# Patient Record
Sex: Male | Born: 1970 | Race: White | Hispanic: No | Smoking: Current every day smoker
Health system: Southern US, Community
[De-identification: ages and names within clinical notes are randomized; demographics above are authoritative.]

## PROBLEM LIST (undated history)

## (undated) DIAGNOSIS — Z5189 Encounter for other specified aftercare: Secondary | ICD-10-CM

## (undated) DIAGNOSIS — B019 Varicella without complication: Secondary | ICD-10-CM

## (undated) DIAGNOSIS — H409 Unspecified glaucoma: Secondary | ICD-10-CM

## (undated) DIAGNOSIS — Z6372 Alcoholism and drug addiction in family: Secondary | ICD-10-CM

## (undated) DIAGNOSIS — N189 Chronic kidney disease, unspecified: Secondary | ICD-10-CM

## (undated) DIAGNOSIS — F32A Depression, unspecified: Secondary | ICD-10-CM

## (undated) DIAGNOSIS — F1910 Other psychoactive substance abuse, uncomplicated: Secondary | ICD-10-CM

## (undated) DIAGNOSIS — F419 Anxiety disorder, unspecified: Secondary | ICD-10-CM

## (undated) DIAGNOSIS — F329 Major depressive disorder, single episode, unspecified: Secondary | ICD-10-CM

## (undated) HISTORY — DX: Unspecified glaucoma: H40.9

## (undated) HISTORY — DX: Anxiety disorder, unspecified: F41.9

## (undated) HISTORY — DX: Other psychoactive substance abuse, uncomplicated: F19.10

## (undated) HISTORY — PX: WRIST SURGERY: SHX841

## (undated) HISTORY — DX: Varicella without complication: B01.9

## (undated) HISTORY — DX: Encounter for other specified aftercare: Z51.89

## (undated) HISTORY — PX: OTHER SURGICAL HISTORY: SHX169

## (undated) HISTORY — PX: KNEE SURGERY: SHX244

## (undated) HISTORY — DX: Alcoholism and drug addiction in family: Z63.72

---

## 2005-09-25 ENCOUNTER — Emergency Department: Payer: Self-pay | Admitting: Emergency Medicine

## 2009-03-19 ENCOUNTER — Emergency Department: Payer: Self-pay | Admitting: Emergency Medicine

## 2009-04-02 ENCOUNTER — Inpatient Hospital Stay: Payer: Self-pay | Admitting: Psychiatry

## 2009-11-29 ENCOUNTER — Ambulatory Visit: Payer: Self-pay | Admitting: Unknown Physician Specialty

## 2011-06-24 ENCOUNTER — Emergency Department: Payer: Self-pay | Admitting: Internal Medicine

## 2012-06-02 ENCOUNTER — Emergency Department: Payer: Self-pay | Admitting: Emergency Medicine

## 2012-08-05 ENCOUNTER — Emergency Department: Payer: Self-pay | Admitting: Emergency Medicine

## 2013-04-29 ENCOUNTER — Emergency Department: Payer: Self-pay | Admitting: Emergency Medicine

## 2013-08-24 ENCOUNTER — Emergency Department: Payer: Self-pay | Admitting: Emergency Medicine

## 2013-08-24 LAB — COMPREHENSIVE METABOLIC PANEL
Albumin: 3.5 g/dL (ref 3.4–5.0)
Alkaline Phosphatase: 163 U/L — ABNORMAL HIGH
Anion Gap: 8 (ref 7–16)
BILIRUBIN TOTAL: 0.3 mg/dL (ref 0.2–1.0)
BUN: 9 mg/dL (ref 7–18)
CALCIUM: 9 mg/dL (ref 8.5–10.1)
CO2: 30 mmol/L (ref 21–32)
Chloride: 102 mmol/L (ref 98–107)
Creatinine: 0.93 mg/dL (ref 0.60–1.30)
EGFR (African American): >60
EGFR (Non-African Amer.): >60
Glucose: 109 mg/dL — ABNORMAL HIGH (ref 65–99)
OSMOLALITY: 279 (ref 275–301)
Potassium: 3.3 mmol/L — ABNORMAL LOW (ref 3.5–5.1)
SGOT(AST): 24 U/L (ref 15–37)
SGPT (ALT): 34 U/L (ref 12–78)
Sodium: 140 mmol/L (ref 136–145)
Total Protein: 7.8 g/dL (ref 6.4–8.2)

## 2013-08-24 LAB — CBC WITH DIFFERENTIAL/PLATELET
BASOS ABS: 0.1 10*3/uL (ref 0.0–0.1)
BASOS PCT: 0.9 %
Eosinophil #: 0.2 10*3/uL (ref 0.0–0.7)
Eosinophil %: 2.2 %
HCT: 41 % (ref 40.0–52.0)
HGB: 13 g/dL (ref 13.0–18.0)
LYMPHS ABS: 2.4 10*3/uL (ref 1.0–3.6)
Lymphocyte %: 22.9 %
MCH: 27.4 pg (ref 26.0–34.0)
MCHC: 31.8 g/dL — ABNORMAL LOW (ref 32.0–36.0)
MCV: 86 fL (ref 80–100)
Monocyte #: 0.4 x10 3/mm (ref 0.2–1.0)
Monocyte %: 3.6 %
Neutrophil #: 7.5 10*3/uL — ABNORMAL HIGH (ref 1.4–6.5)
Neutrophil %: 70.4 %
Platelet: 376 10*3/uL (ref 150–440)
RBC: 4.76 10*6/uL (ref 4.40–5.90)
RDW: 13 % (ref 11.5–14.5)
WBC: 10.7 10*3/uL — ABNORMAL HIGH (ref 3.8–10.6)

## 2013-08-29 LAB — CULTURE, BLOOD (SINGLE)

## 2014-06-27 ENCOUNTER — Encounter: Payer: Self-pay | Admitting: *Deleted

## 2014-06-27 ENCOUNTER — Emergency Department
Admission: EM | Admit: 2014-06-27 | Discharge: 2014-06-27 | Disposition: A | Discharge status: Home / Self Care | Payer: Self-pay | Attending: Emergency Medicine | Admitting: Emergency Medicine

## 2014-06-27 DIAGNOSIS — K047 Periapical abscess without sinus: Secondary | ICD-10-CM | POA: Insufficient documentation

## 2014-06-27 DIAGNOSIS — K029 Dental caries, unspecified: Secondary | ICD-10-CM | POA: Insufficient documentation

## 2014-06-27 DIAGNOSIS — Z720 Tobacco use: Secondary | ICD-10-CM | POA: Insufficient documentation

## 2014-06-27 DIAGNOSIS — K0381 Cracked tooth: Secondary | ICD-10-CM | POA: Insufficient documentation

## 2014-06-27 MED ORDER — HYDROCODONE-ACETAMINOPHEN 5-325 MG PO TABS
ORAL_TABLET | ORAL | Status: AC
  Administered 2014-06-27: 1 via ORAL
  Filled 2014-06-27: qty 1

## 2014-06-27 MED ORDER — PENICILLIN V POTASSIUM 500 MG PO TABS
500.0000 mg | ORAL_TABLET | Freq: Once | ORAL | Status: AC
  Administered 2014-06-27: 500 mg via ORAL

## 2014-06-27 MED ORDER — HYDROCODONE-ACETAMINOPHEN 5-325 MG PO TABS: 1.0000 | ORAL_TABLET | ORAL | Status: DC | PRN

## 2014-06-27 MED ORDER — PENICILLIN V POTASSIUM 500 MG PO TABS
ORAL_TABLET | ORAL | Status: AC
  Administered 2014-06-27: 500 mg via ORAL
  Filled 2014-06-27: qty 1

## 2014-06-27 MED ORDER — KETOROLAC TROMETHAMINE 10 MG PO TABS: 10.0000 mg | ORAL_TABLET | Freq: Three times a day (TID) | ORAL | Status: DC

## 2014-06-27 MED ORDER — PENICILLIN V POTASSIUM 500 MG PO TABS: 500.0000 mg | ORAL_TABLET | Freq: Four times a day (QID) | ORAL | Status: DC

## 2014-06-27 MED ORDER — HYDROCODONE-ACETAMINOPHEN 5-325 MG PO TABS
1.0000 | ORAL_TABLET | Freq: Once | ORAL | Status: AC
  Administered 2014-06-27: 1 via ORAL

## 2014-06-27 MED ORDER — LIDOCAINE-EPINEPHRINE 2 %-1:100000 IJ SOLN
INTRAMUSCULAR | Status: AC
  Filled 2014-06-27: qty 1.7

## 2014-06-27 NOTE — ED Provider Notes (Signed)
[facility]? ____________________________________________ ? Time seen: 2145 ? I have reviewed the triage vital signs and the nursing notes. ________ HISTORY ? Chief Complaint Dental Pain  HPI  Gerald RobsonJeffery T Aguinaldo Sr. is a 44 y.o. male who reports to the ED with right upper jaw and face swelling 3 days. Denies fever, chills, sweats. He notes a "knot" to the upper right jaw, with tenderness to palpation. He denies any spontaneous drainage of purulent material into the mouth. He does note that this not has increased in size of the last 3 days.    Review of Systems  Constitutional: Negative for fever. Eyes: Negative for visual changes. ENT: Negative for sore throat. Cardiovascular: Negative for chest pain. Respiratory: Negative for shortness of breath. Gastrointestinal: Negative for abdominal pain, vomiting and diarrhea. Musculoskeletal: Negative for back pain. Skin: Negative for rash. Neurological: Negative for headaches, focal weakness or numbness.  10-point ROS otherwise negative. ____________________________________________  History reviewed. No pertinent past medical history.  There are no active problems to display for this patient.  History reviewed. No pertinent past surgical history. ? Current Outpatient Rx  Name  Route  Sig  Dispense  Refill  . HYDROcodone-acetaminophen (NORCO) 5-325 MG per tablet   Oral   Take 1 tablet by mouth every 4 (four) hours as needed for moderate pain.   6 tablet   0   . ketorolac (TORADOL) 10 MG tablet   Oral   Take 1 tablet (10 mg total) by mouth every 8 (eight) hours.   15 tablet   0   . penicillin v potassium (VEETID) 500 MG tablet   Oral   Take 1 tablet (500 mg total) by mouth 4 (four) times daily.   40 tablet   0    ? Allergies Review of patient's allergies indicates no known allergies. ? History reviewed. No pertinent family history. ? Social  History History  Substance Use Topics  . Smoking status: Current Every Day Smoker    Types: Cigarettes  . Smokeless tobacco: Not on file  . Alcohol Use: Yes     Comment: occasionally    PHYSICAL EXAM:  VITAL SIGNS: ED Triage Vitals  Enc Vitals Group     BP 06/27/14 2045 122/76 mmHg     Pulse Rate 06/27/14 2045 65     Resp 06/27/14 2045 20     Temp 06/27/14 2045 98.2 F (36.8 C)     Temp Source 06/27/14 2045 Oral     SpO2 06/27/14 2045 100 %     Weight 06/27/14 2045 196 lb 4.8 oz (89.041 kg)     Height 06/27/14 2045 5\' 11"  (1.803 m)     Head Cir --      Peak Flow --      Pain Score 06/27/14 2046 10     Pain Loc --      Pain Edu? --      Excl. in GC? --     Constitutional: Alert and oriented. Well appearing and in no distress. Eyes: Conjunctivae are normal. PERRL. Normal extraocular movements. ENT   Head: Normocephalic and atraumatic.   Nose: No congestion/rhinnorhea.   Mouth/Throat: Mucous membranes are moist.      Ears: Normal external exam. Canals clear. TMs clear bilaterally.   Neck: Supple. No lymphadenopathy. Cardiovascular: Normal rate, regular rhythm. Normal and symmetric distal pulses are present in all extremities. No murmurs, rubs, or gallops. Respiratory: Normal respiratory effort without tachypnea nor retractions. Breath sounds are  clear and equal bilaterally. No wheezes/rales/rhonchi. Gastrointestinal: Soft and nontender.  Musculoskeletal: Normal range of motion in all extremities.  Neurologic:  Normal speech and language. No gross focal neurologic deficits are appreciated.  Skin:  Skin is warm, dry and intact. No rash noted. Psychiatric: Mood and affect are normal. Patient exhibits appropriate insight and judgment. ______________ PROCEDURES ? Procedure(s) performed: Dental block #3. Lidocaine 2% + epi. Spontaneous drainage of purulent material from the fluctuant area overlying the first molar the left side. Expression of this area yields a  moderate amount of infectious material, with softening of the previous abscess after. Patient discharged procedure well.  Current Outpatient Rx  Name  Route  Sig  Dispense  Refill  . HYDROcodone-acetaminophen (NORCO) 5-325 MG per tablet   Oral   Take 1 tablet by mouth every 4 (four) hours as needed for moderate pain.   6 tablet   0   . ketorolac (TORADOL) 10 MG tablet   Oral   Take 1 tablet (10 mg total) by mouth every 8 (eight) hours.   15 tablet   0   . penicillin v potassium (VEETID) 500 MG tablet   Oral   Take 1 tablet (500 mg total) by mouth 4 (four) times daily.   40 tablet   0    ______________________________________________________ INITIAL IMPRESSION / ASSESSMENT AND PLAN / ED COURSE ? Acute dental abscess and generalized dental caries and chronic dental fracture. Mouth abscess and dental care instructions given to patient.  Pertinent labs & imaging results that were available during my care of the patient were reviewed by me and considered in my medical decision making (see chart for details). ____________________________________________ FINAL CLINICAL IMPRESSION(S) / ED DIAGNOSES?  Final diagnoses:  Dental abscess  Dental caries      Lissa HoardJenise V Bacon Lilliona Blakeney, PA-C 06/27/14 2242  Jene Everyobert Kinner, MD 06/27/14 [phone]

## 2014-06-27 NOTE — Discharge Instructions (Signed)
Dental Abscess °A dental abscess is a collection of infected fluid (pus) from a bacterial infection in the inner part of the tooth (pulp). It usually occurs at the end of the tooth's root.  °CAUSES  °· Severe tooth decay. °· Trauma to the tooth that allows bacteria to enter into the pulp, such as a broken or chipped tooth. °SYMPTOMS  °· Severe pain in and around the infected tooth. °· Swelling and redness around the abscessed tooth or in the mouth or face. °· Tenderness. °· Pus drainage. °· Bad breath. °· Bitter taste in the mouth. °· Difficulty swallowing. °· Difficulty opening the mouth. °· Nausea. °· Vomiting. °· Chills. °· Swollen neck glands. °DIAGNOSIS  °· A medical and dental history will be taken. °· An examination will be performed by tapping on the abscessed tooth. °· X-rays may be taken of the tooth to identify the abscess. °TREATMENT °The goal of treatment is to eliminate the infection. You may be prescribed antibiotic medicine to stop the infection from spreading. A root canal may be performed to save the tooth. If the tooth cannot be saved, it may be pulled (extracted) and the abscess may be drained.  °HOME CARE INSTRUCTIONS °· Only take over-the-counter or prescription medicines for pain, fever, or discomfort as directed by your caregiver. °· Rinse your mouth (gargle) often with salt water (¼ tsp salt in 8 oz [250 ml] of warm water) to relieve pain or swelling. °· Do not drive after taking pain medicine (narcotics). °· Do not apply heat to the outside of your face. °· Return to your dentist for further treatment as directed. °SEEK MEDICAL CARE IF: °· Your pain is not helped by medicine. °· Your pain is getting worse instead of better. °SEEK IMMEDIATE MEDICAL CARE IF: °· You have a fever or persistent symptoms for more than 2-3 days. °· You have a fever and your symptoms suddenly get worse. °· You have chills or a very bad headache. °· You have problems breathing or swallowing. °· You have trouble  opening your mouth. °· You have swelling in the neck or around the eye. °Document Released: 02/05/2005 Document Revised: 10/31/2011 Document Reviewed: 05/16/2010 °ExitCare® Patient Information ©2015 [employer]. This information is not intended to replace advice given to you by your health care provider. Make sure you discuss any questions you have with your health care provider. ° ° °OPTIONS FOR DENTAL FOLLOW UP CARE ° °[place] [facility] and [facility] - Local Safety Net Dental Clinics °http://www.ncdhhs.gov/dph/oralhealth/services/safetynetclinics.htm °  °[facility] (336-562-3123) ° °[facility] [place] (919-933-9087) ° °[facility] (919-663-1744 ext 237) ° °[facility] (336-570-6415) ° °[facility] (919-968-2025) °This clinic caters to the indigent population and is on a lottery system. °Location: °[facility], [address], [place] °Clinic Hours: °Wednesdays from 6pm - 9pm, patients seen by a lottery system. °For dates, call or go to www.med.[facility].edu/[facility]/patients/Dental-[facility] °Services: °Cleanings, fillings and simple extractions. °Payment Options: °DENTAL WORK IS FREE OF CHARGE. Bring proof of income or support. °Best way to get seen: °Arrive at 5:15 pm - this is a lottery, NOT first come/first serve, so arriving earlier will not increase your chances of being seen. °  °  °[facility] °919-537-3737 °Select option 1 for emergencies °  °Location: °[facility], [address], [place] °Clinic Hours: °No walk-ins accepted - call the day before to schedule an appointment. °Check in times   are 9:30 am and 1:30 pm. °Services: °Simple extractions, temporary fillings, pulpectomy/pulp debridement, uncomplicated abscess drainage. °Payment Options: °PAYMENT IS DUE AT THE TIME OF SERVICE.  Fee is usually $100-200, additional surgical procedures (e.g. abscess drainage) may  be extra. °Cash, checks, Visa/MasterCard accepted.  Can file Medicaid if patient is covered for dental - patient should call case worker to check. °No discount for [facility] patients. °Best way to get seen: °MUST call the day before and get onto the schedule. Can usually be seen the next 1-2 days. No walk-ins accepted. °  °  °[facility] °919-933-9087 °  °Location: °[facility], [address], [place] °Clinic Hours: °M, W, Th, F 8am or 1:30pm, Tues 9a or 1:30 - first come/first served. °Services: °Simple extractions, temporary fillings, uncomplicated abscess drainage.  You do not need to be an [place] resident. °Payment Options: °PAYMENT IS DUE AT THE TIME OF SERVICE. °Dental insurance, otherwise sliding scale - bring proof of income or support. °Depending on income and treatment needed, cost is usually $50-200. °Best way to get seen: °Arrive early as it is first come/first served. °  °  °[facility] °[phone] °  °Location: °[address] °Clinic Hours: °Mon-Thu 8a-5p °Services: °Most basic dental services including extractions and fillings. °Payment Options: °PAYMENT IS DUE AT THE TIME OF SERVICE. °Sliding scale, up to 50% off - bring proof if income or support. °Medicaid with dental option accepted. °Best way to get seen: °Call to schedule an appointment, can usually be seen within 2 weeks OR they will try to see walk-ins - show up at 8a or 2p (you may have to wait). °  °  °[facility] °919-245-2435 °[place] RESIDENTS ONLY °  °Location: °[facility], [address], [place], [place] [address] °Clinic Hours: By appointment only. °Monday - Thursday 8am-5pm, Friday 8am-12pm °Services: Cleanings, fillings, extractions. °Payment Options: °PAYMENT IS DUE AT THE TIME OF SERVICE. °Cash, Visa or MasterCard. Sliding scale - $30 minimum per service. °Best way to get seen: °Come in to  office, complete packet and make an appointment - need proof of income °or support monies for each household member and proof of [place] residence. °Usually takes about a month to get in. °  °  °[facility] °[phone] °  °Location: °[address] [place] °Clinic Hours: Walk-in Urgent Care Dental Services are offered Monday-Friday mornings only. °The numbers of emergencies accepted daily is limited to the number of °providers available. °Maximum 15 - Mondays, Wednesdays & Thursdays °Maximum 10 - Tuesdays & Fridays °Services: °You do not need to be a [place] resident to be seen for a dental emergency. °Emergencies are defined as pain, swelling, abnormal bleeding, or dental trauma. Walkins will receive x-rays if needed. °NOTE: Dental cleaning is not an emergency. °Payment Options: °PAYMENT IS DUE AT THE TIME OF SERVICE. °Minimum co-pay is $40.00 for uninsured patients. °Minimum co-pay is $3.00 for Medicaid with dental coverage. °Dental Insurance is accepted and must be presented at time of visit. °Medicare does not cover dental. °Forms of payment: Cash, credit card, checks. °Best way to get seen: °If not previously registered with the clinic, walk-in dental registration begins at 7:15 am and is on a first come/first serve basis. °If previously registered with the clinic, call to make an appointment. °  °  °[facility] °919-776-4359 °[place] RESIDENTS ONLY °  °Location: °[address],   [place], [place] °Clinic Hours: °Mon-Thu 10a-2p °Services: Extractions only! °Payment Options: °FREE (donations accepted) - bring proof of income or support °Best way to get seen: °Call and schedule an appointment OR come at 8am on the 1st Monday of every month (except for holidays) when it is first come/first served. °  °  °[facility] °[phone] °  °Location: °[address], [place] °Clinic Hours: °Friday mornings °Services, Payment Options, Best way to get  seen: °Call for info °

## 2014-06-27 NOTE — ED Notes (Signed)
Pt presents w/ c/o dental pain, w/ recent swelling to gum and face.

## 2014-10-28 ENCOUNTER — Emergency Department: Payer: Self-pay

## 2014-10-28 ENCOUNTER — Emergency Department
Admission: EM | Admit: 2014-10-28 | Discharge: 2014-10-28 | Disposition: A | Discharge status: Home / Self Care | Payer: Self-pay | Attending: Emergency Medicine | Admitting: Emergency Medicine

## 2014-10-28 ENCOUNTER — Encounter: Payer: Self-pay | Admitting: Emergency Medicine

## 2014-10-28 DIAGNOSIS — Z720 Tobacco use: Secondary | ICD-10-CM | POA: Insufficient documentation

## 2014-10-28 DIAGNOSIS — Y9389 Activity, other specified: Secondary | ICD-10-CM | POA: Insufficient documentation

## 2014-10-28 DIAGNOSIS — X58XXXA Exposure to other specified factors, initial encounter: Secondary | ICD-10-CM | POA: Insufficient documentation

## 2014-10-28 DIAGNOSIS — Y9289 Other specified places as the place of occurrence of the external cause: Secondary | ICD-10-CM | POA: Insufficient documentation

## 2014-10-28 DIAGNOSIS — Y990 Civilian activity done for income or pay: Secondary | ICD-10-CM | POA: Insufficient documentation

## 2014-10-28 DIAGNOSIS — S20212A Contusion of left front wall of thorax, initial encounter: Secondary | ICD-10-CM | POA: Insufficient documentation

## 2014-10-28 MED ORDER — IBUPROFEN 800 MG PO TABS: 800.0000 mg | ORAL_TABLET | Freq: Three times a day (TID) | ORAL | Status: DC | PRN

## 2014-10-28 MED ORDER — HYDROCODONE-ACETAMINOPHEN 5-325 MG PO TABS: 1.0000 | ORAL_TABLET | ORAL | Status: DC | PRN

## 2014-10-28 MED ORDER — CYCLOBENZAPRINE HCL 5 MG PO TABS: 5.0000 mg | ORAL_TABLET | Freq: Three times a day (TID) | ORAL | Status: DC | PRN

## 2014-10-28 NOTE — Discharge Instructions (Signed)
Chest Contusion °A contusion is a deep bruise. Bruises happen when an injury causes bleeding under the skin. Signs of bruising include pain, puffiness (swelling), and discolored skin. The bruise may turn blue, purple, or yellow.  °HOME CARE °· Put ice on the injured area. °¨ Put ice in a plastic bag. °¨ Place a towel between the skin and the bag. °¨ Leave the ice on for 15-20 minutes at a time, 03-04 times a day for the first 48 hours. °· Only take medicine as told by your doctor. °· Rest. °· Take deep breaths (deep-breathing exercises) as told by your doctor. °· Stop smoking if you smoke. °· Do not lift objects over 5 pounds (2.3 kilograms) for 3 days or longer if told by your doctor. °GET HELP RIGHT AWAY IF:  °· You have more bruising or puffiness. °· You have pain that gets worse. °· You have trouble breathing. °· You are dizzy, weak, or pass out (faint). °· You have blood in your pee (urine) or poop (stool). °· You cough up or throw up (vomit) blood. °· Your puffiness or pain is not helped with medicines. °MAKE SURE YOU:  °· Understand these instructions. °· Will watch your condition. °· Will get help right away if you are not doing well or get worse. °Document Released: 07/25/2007 Document Revised: 10/31/2011 Document Reviewed: 07/30/2011 °ExitCare® Patient Information ©2015 [employer]. This information is not intended to replace advice given to you by your health care provider. Make sure you discuss any questions you have with your health care provider. ° °

## 2014-10-28 NOTE — ED Notes (Signed)
Patient to ED with report of injury to ribs on left side a couple of days ago. Patient reports was pushing a board at work and since that time pain has been significant.

## 2014-10-28 NOTE — ED Provider Notes (Signed)
[facility] Emergency Department Provider Note  ____________________________________________  Time seen: Approximately 3:01 PM  I have reviewed the triage vital signs and the nursing notes.   HISTORY  Chief Complaint Rib Injury    HPI Gerald HESTAND Sr. is a 44 y.o. male who presents for evaluation of injury to left ribs 2 days ago. Patient states he was pushing and moving a board when he felt something pop in his left rib area. States that he's had increased pains with deep breathing. Denies any shortness of breath presents to rule out fracture.   History reviewed. No pertinent past medical history.  There are no active problems to display for this patient.   History reviewed. No pertinent past surgical history.  Current Outpatient Rx  Name  Route  Sig  Dispense  Refill  . cyclobenzaprine (FLEXERIL) 5 MG tablet   Oral   Take 1 tablet (5 mg total) by mouth every 8 (eight) hours as needed for muscle spasms.   30 tablet   0   . HYDROcodone-acetaminophen (NORCO) 5-325 MG per tablet   Oral   Take 1-2 tablets by mouth every 4 (four) hours as needed for moderate pain.   15 tablet   0   . ibuprofen (ADVIL,MOTRIN) 800 MG tablet   Oral   Take 1 tablet (800 mg total) by mouth every 8 (eight) hours as needed.   30 tablet   0     Allergies Review of patient's allergies indicates no known allergies.  History reviewed. No pertinent family history.  Social History Social History  Substance Use Topics  . Smoking status: Current Every Day Smoker    Types: Cigarettes  . Smokeless tobacco: None  . Alcohol Use: Yes     Comment: occasionally    Review of Systems Constitutional: No fever/chills Eyes: No visual changes. ENT: No sore throat. Cardiovascular: Denies chest pain. Respiratory: Denies shortness of breath. Gastrointestinal: No abdominal pain.  No nausea, no vomiting.  No diarrhea.  No constipation. Genitourinary: Negative for  dysuria. Musculoskeletal: Positive for left rib musculoskeletal pain. Anterior chest wall Skin: Negative for rash. Neurological: Negative for headaches, focal weakness or numbness.  10-point ROS otherwise negative.  ____________________________________________   PHYSICAL EXAM:  VITAL SIGNS: ED Triage Vitals  Enc Vitals Group     BP 10/28/14 1318 121/77 mmHg     Pulse Rate 10/28/14 1318 99     Resp 10/28/14 1318 20     Temp 10/28/14 1318 98 F (36.7 C)     Temp Source 10/28/14 1318 Oral     SpO2 10/28/14 1318 99 %     Weight 10/28/14 1318 200 lb (90.719 kg)     Height 10/28/14 1318  (1.803 m)     Head Cir --      Peak Flow --      Pain Score 10/28/14 1320 8     Pain Loc --      Pain Edu? --      Excl. in GC? --     Constitutional: Alert and oriented. Well appearing and in no acute distress.ous. Neck: No stridor.   Cardiovascular: Normal rate, regular rhythm. Grossly normal heart sounds.  Good peripheral circulation. Respiratory: Normal respiratory effort.  No retractions. Lungs CTAB. Musculoskeletal: No lower extremity tenderness nor edema.  No joint effusions. Anterior left rib cage tenderness. No ecchymosis or bruising noted. Neurologic:  Normal speech and language. No gross focal neurologic deficits are appreciated. No gait instability. Skin:  Skin is warm, dry and intact. No rash noted. Psychiatric: Mood and affect are normal. Speech and behavior are normal.  ____________________________________________   LABS (all labs ordered are listed, but only abnormal results are displayed)  Labs Reviewed - No data to display ____________________________________________   RADIOLOGY  IMPRESSION: Normal left ribs. No acute cardiopulmonary abnormality seen. ____________________________________________   PROCEDURES  Procedure(s) performed: None  Critical Care performed: No  ____________________________________________   INITIAL IMPRESSION / ASSESSMENT AND  PLAN / ED COURSE  Pertinent labs & imaging results that were available during my care of the patient were reviewed by me and considered in my medical decision making (see chart for details).  Rx given for Flexeril 5 mg 3 times a day, Motrin 800 mg 3 times a day. Also Norco 5/325 given as needed for severe pain. Patient follow-up with PCP or return to the ER if any worsening symptomology. ____________________________________________   FINAL CLINICAL IMPRESSION(S) / ED DIAGNOSES  Final diagnoses:  Rib contusion, left, initial encounter      Evangeline Dakin, PA-C 10/28/14 1641  Darien Ramus, MD 10/28/14 [phone]

## 2015-06-22 ENCOUNTER — Encounter: Payer: Self-pay | Admitting: *Deleted

## 2015-06-22 ENCOUNTER — Emergency Department: Payer: Self-pay

## 2015-06-22 ENCOUNTER — Emergency Department
Admission: EM | Admit: 2015-06-22 | Discharge: 2015-06-22 | Disposition: A | Discharge status: Home / Self Care | Payer: Self-pay | Attending: Emergency Medicine | Admitting: Emergency Medicine

## 2015-06-22 DIAGNOSIS — Y929 Unspecified place or not applicable: Secondary | ICD-10-CM | POA: Insufficient documentation

## 2015-06-22 DIAGNOSIS — W11XXXA Fall on and from ladder, initial encounter: Secondary | ICD-10-CM | POA: Insufficient documentation

## 2015-06-22 DIAGNOSIS — Y999 Unspecified external cause status: Secondary | ICD-10-CM | POA: Insufficient documentation

## 2015-06-22 DIAGNOSIS — Y939 Activity, unspecified: Secondary | ICD-10-CM | POA: Insufficient documentation

## 2015-06-22 DIAGNOSIS — Z791 Long term (current) use of non-steroidal anti-inflammatories (NSAID): Secondary | ICD-10-CM | POA: Insufficient documentation

## 2015-06-22 DIAGNOSIS — Z79899 Other long term (current) drug therapy: Secondary | ICD-10-CM | POA: Insufficient documentation

## 2015-06-22 DIAGNOSIS — S52502A Unspecified fracture of the lower end of left radius, initial encounter for closed fracture: Secondary | ICD-10-CM | POA: Insufficient documentation

## 2015-06-22 DIAGNOSIS — F17210 Nicotine dependence, cigarettes, uncomplicated: Secondary | ICD-10-CM | POA: Insufficient documentation

## 2015-06-22 MED ORDER — OXYCODONE-ACETAMINOPHEN 5-325 MG PO TABS: 1.0000 | ORAL_TABLET | Freq: Four times a day (QID) | ORAL | Status: DC | PRN

## 2015-06-22 MED ORDER — OXYCODONE-ACETAMINOPHEN 5-325 MG PO TABS
1.0000 | ORAL_TABLET | Freq: Once | ORAL | Status: AC
  Administered 2015-06-22: 1 via ORAL
  Filled 2015-06-22: qty 1

## 2015-06-22 NOTE — Discharge Instructions (Signed)
Call to schedule a follow up with orthopedics. Apply ice over the splint 20 minutes per hour. Do not remove the splint. Return to the ER for symptoms of concern if unable to see orthopedics right away.    Forearm Fracture A forearm fracture is a break in one or both of the bones of your arm that are between the elbow and the wrist. Your forearm is made up of two bones:  Radius. This is the bone on the inside of your arm near your thumb.  Ulna. This is the bone on the outside of your arm near your little finger. Middle forearm fractures usually break both the radius and the ulna. Most forearm fractures that involve both the ulna and radius will require surgery. CAUSES Common causes of this type of fracture include:  Falling on an outstretched arm.  Accidents, such as a car or bike accident.  A hard, direct hit to the middle part of your arm. RISK FACTORS You may be at higher risk for this type of fracture if:  You play contact sports.  You have a condition that causes your bones to be weak or thin (osteoporosis). SIGNS AND SYMPTOMS A forearm fracture causes pain immediately after the injury. Other signs and symptoms include:  An abnormal bend or bump in your arm (deformity).  Swelling.  Numbness or tingling.  Tenderness.  Inability to turn your hand from side to side (rotate).  Bruising. DIAGNOSIS Your health care provider may diagnose a forearm fracture based on:  Your symptoms.  Your medical history, including any recent injury.  A physical exam. Your health care provider will look for any deformity and feel for tenderness over the break. Your health care provider will also check whether the bones are out of place.  An X-ray exam to confirm the diagnosis and learn more about the type of fracture. TREATMENT The goals of treatment are to get the bone or bones in proper position for healing and to keep the bones from moving so they will heal over time. Your treatment  will depend on many factors, especially the type of fracture that you have.  If the fractured bone or bones:  Are in the correct position (nondisplaced), you may only need to wear a cast or a splint.  Have a slightly displaced fracture, you may need to have the bones moved back into place manually (closed reduction) before the splint or cast is put on.  You may have a temporary splint before you have a cast. The splint allows room for some swelling. After a few days, a cast can replace the splint.  You may have to wear the cast for 6-8 weeks or as directed by your health care provider.  The cast may be changed after about 3 weeks or as directed by your health care provider.  After your cast is removed, you may need physical therapy to regain full movement in your wrist or elbow.  You may need emergency surgery if you have:  A fractured bone or bones that are out of position (displaced).  A fracture with multiple fragments (comminuted fracture).  A fracture that breaks the skin (open fracture). This type of fracture may require surgical wires, plates, or screws to hold the bone or bones in place.  You may have X-rays every couple of weeks to check on your healing. HOME CARE INSTRUCTIONS If You Have a Cast:  Do not stick anything inside the cast to scratch your skin. Doing that increases your  risk of infection.  Check the skin around the cast every day. Report any concerns to your health care provider. You may put lotion on dry skin around the edges of the cast. Do not apply lotion to the skin underneath the cast. If You Have a Splint:  Wear it as directed by your health care provider. Remove it only as directed by your health care provider.  Loosen the splint if your fingers become numb and tingle, or if they turn cold and blue. Bathing  Cover the cast or splint with a watertight plastic bag to protect it from water while you bathe or shower. Do not let the cast or splint get  wet. Managing Pain, Stiffness, and Swelling  If directed, apply ice to the injured area:  Put ice in a plastic bag.  Place a towel between your skin and the bag.  Leave the ice on for 20 minutes, 2-3 times a day.  Move your fingers often to avoid stiffness and to lessen swelling.  Raise the injured area above the level of your heart while you are sitting or lying down. Driving  Do not drive or operate heavy machinery while taking pain medicine.  Do not drive while wearing a cast or splint on a hand that you use for driving. Activity  Return to your normal activities as directed by your health care provider. Ask your health care provider what activities are safe for you.  Perform range-of-motion exercises only as directed by your health care provider. Safety  Do not use your injured limb to support your body weight until your health care provider says that you can. General Instructions  Do not put pressure on any part of the cast or splint until it is fully hardened. This may take several hours.  Keep the cast or splint clean and dry.  Do not use any tobacco products, including cigarettes, chewing tobacco, or electronic cigarettes. Tobacco can delay bone healing. If you need help quitting, ask your health care provider.  Take medicines only as directed by your health care provider.  Keep all follow-up visits as directed by your health care provider. This is important. SEEK MEDICAL CARE IF:  Your pain medicine is not helping.  Your cast or splint becomes wet or damaged or suddenly feels too tight.  Your cast becomes loose.  You have more severe pain or swelling than you did before the cast.  You have severe pain when you stretch your fingers.  You continue to have pain or stiffness in your elbow or your wrist after your cast is removed. SEEK IMMEDIATE MEDICAL CARE IF:  You cannot move your fingers.  You lose feeling in your fingers or your hand.  Your hand or  your fingers turn cold and pale or blue.  You notice a bad smell coming from your cast.  You have drainage from underneath your cast.  You have new stains from blood or drainage that is coming through your cast.   This information is not intended to replace advice given to you by your health care provider. Make sure you discuss any questions you have with your health care provider.   Document Released: 02/03/2000 Document Revised: 02/26/2014 Document Reviewed: 09/21/2013 Elsevier Interactive Patient Education [employer]

## 2015-06-22 NOTE — ED Notes (Signed)
States he fell off a ladder and now has left wrist pain and swelling, radial pulse intact

## 2015-06-22 NOTE — ED Provider Notes (Signed)
[facility] Emergency Department Provider Note ____________________________________________  Time seen: Approximately 3:05 PM  I have reviewed the triage vital signs and the nursing notes.   HISTORY  Chief Complaint Wrist Pain    HPI Gerald RANSFORD Sr. is a 45 y.o. male who presents to the emergency department for evaluation of left wrist and elbow pain post fall. He was about 4' up on a ladder and "stepped wrong" which caused him to fall. He landed on a brick porch with his left arm extended. He now has pain in the left elbow and left wrist. He has some minor abrasions over the left hand and knee. Patient is left hand dominant.   History reviewed. No pertinent past medical history.  There are no active problems to display for this patient.   History reviewed. No pertinent past surgical history.  Current Outpatient Rx  Name  Route  Sig  Dispense  Refill  . cyclobenzaprine (FLEXERIL) 5 MG tablet   Oral   Take 1 tablet (5 mg total) by mouth every 8 (eight) hours as needed for muscle spasms.   30 tablet   0   . HYDROcodone-acetaminophen (NORCO) 5-325 MG per tablet   Oral   Take 1-2 tablets by mouth every 4 (four) hours as needed for moderate pain.   15 tablet   0   . ibuprofen (ADVIL,MOTRIN) 800 MG tablet   Oral   Take 1 tablet (800 mg total) by mouth every 8 (eight) hours as needed.   30 tablet   0   . oxyCODONE-acetaminophen (ROXICET) 5-325 MG tablet   Oral   Take 1 tablet by mouth every 6 (six) hours as needed.   20 tablet   0     Allergies Review of patient's allergies indicates no known allergies.  History reviewed. No pertinent family history.  Social History Social History  Substance Use Topics  . Smoking status: Current Every Day Smoker    Types: Cigarettes  . Smokeless tobacco: None  . Alcohol Use: Yes     Comment: occasionally    Review of Systems Constitutional: No recent illness. Respiratory: Denies shortness of  breath. Musculoskeletal: Pain in left elbow and wrist. Skin: Negative for wound. Neurological: Negative for focal weakness or numbness. ____________________________________________   PHYSICAL EXAM:  VITAL SIGNS: ED Triage Vitals  Enc Vitals Group     BP 06/22/15 1452 117/77 mmHg     Pulse Rate 06/22/15 1452 55     Resp 06/22/15 1452 18     Temp 06/22/15 1452 98.6 F (37 C)     Temp Source 06/22/15 1452 Oral     SpO2 06/22/15 1452 98 %     Weight 06/22/15 1452 210 lb (95.255 kg)     Height 06/22/15 1452  (1.803 m)     Head Cir --      Peak Flow --      Pain Score 06/22/15 1453 9     Pain Loc --      Pain Edu? --      Excl. in GC? --     Constitutional: Alert and oriented. Uncomfortable appearing and in no acute distress. Eyes: Conjunctivae are normal. EOMI. Head: Atraumatic. Neck: No stridor. Nexus criteria negative. Respiratory: Normal respiratory effort.   Musculoskeletal: Limited ROM of the left wrist due to pain and swelling. Full ROM of left elbow with tenderness on full extension.  Neurologic:  Normal speech and language. No gross focal neurologic deficits are appreciated.  Speech is normal. No gait instability. Skin:  Skin is warm, dry and intact. Atraumatic.  ____________________________________________   LABS (all labs ordered are listed, but only abnormal results are displayed)  Labs Reviewed - No data to display ____________________________________________  RADIOLOGY  Comminuted, impacted distal radial fracture with intra-articular extension.  I, Kem Boroughsari Leyani Gargus, personally viewed and evaluated these images (plain radiographs) as part of my medical decision making, as well as reviewing the written report by the radiologist.  ____________________________________________   PROCEDURES  Procedure(s) performed:   SPLINT APPLICATION  Authorized by: Kem Boroughsari Adarsh Mundorf Consent: Verbal consent obtained. Risks and benefits: risks, benefits and alternatives  were discussed Consent given by: patient Splint applied by: Mellody DanceKeith, ER  technician Location details: Left forearm Splint type: Sugar tong Supplies used: OCL and ACE Post-procedure: The splinted body part was neurovascularly unchanged following the procedure. Patient tolerance: Patient tolerated the procedure well with no immediate complications.      ____________________________________________   INITIAL IMPRESSION / ASSESSMENT AND PLAN / ED COURSE  Pertinent labs & imaging results that were available during my care of the patient were reviewed by me and considered in my medical decision making (see chart for details).  The importance of follow-up was discussed with the patient, as this is his dominant hand. He was advised that he will likely have long term disability if he does not comply with the follow-up and treatment plan. Patient voiced his intent to call tomorrow to schedule an appointment with the orthopedist. Patient will be given a prescription for Percocet and advised to take it only as prescribed if needed. Patient verbalizes understanding of all instructions and agrees with the plan. ____________________________________________   FINAL CLINICAL IMPRESSION(S) / ED DIAGNOSES  Final diagnoses:  Distal radius fracture, left, closed, initial encounter       Chinita PesterCari B Demaryius Imran, FNP 06/22/15 2350  Loleta Roseory Forbach, MD 06/22/15 2353

## 2015-06-22 NOTE — ED Notes (Signed)
States he fell from ladder approx 4 ft  Abrasion noted to right knee  And pain and swelling noted to left wrist area

## 2015-07-01 ENCOUNTER — Other Ambulatory Visit: Payer: Self-pay

## 2015-07-01 ENCOUNTER — Encounter: Payer: Self-pay | Admitting: *Deleted

## 2015-07-01 NOTE — Patient Instructions (Signed)
  Your procedure is scheduled on: 07-07-15 Report to [facility] 2ND FLOOR To find out your arrival time please call [phone] between 1PM - 3PM on 07-06-15  Remember: Instructions that are not followed completely may result in serious medical risk, up to and including death, or upon the discretion of your surgeon and anesthesiologist your surgery may need to be rescheduled.    _X___ 1. Do not eat food or drink liquids after midnight. No gum chewing or hard candies.     _X___ 2. No Alcohol for 24 hours before or after surgery.   ____ 3. Bring all medications with you on the day of surgery if instructed.    ____ 4. Notify your doctor if there is any change in your medical condition     (cold, fever, infections).     Do not wear jewelry, make-up, hairpins, clips or nail polish.  Do not wear lotions, powders, or perfumes. You may wear deodorant.  Do not shave 48 hours prior to surgery. Men may shave face and neck.  Do not bring valuables to the hospital.    [facility] is not responsible for any belongings or valuables.               Contacts, dentures or bridgework may not be worn into surgery.  Leave your suitcase in the car. After surgery it may be brought to your room.  For patients admitted to the hospital, discharge time is determined by your treatment team.   Patients discharged the day of surgery will not be allowed to drive home.   Please read over the following fact sheets that you were given:     ____ Take these medicines the morning of surgery with A SIP OF WATER:    1. MAY TAKE HYDROCODONE IF NEEDED AM OF SURGERY  2.   3.   4.  5.  6.  ____ Fleet Enema (as directed)   ____ Use CHG Soap as directed  ____ Use inhalers on the day of surgery  ____ Stop metformin 2 days prior to surgery    ____ Take 1/2 of usual insulin dose the night before surgery and none on the morning of surgery.   ____ Stop Coumadin/Plavix/aspirin-N/A  _X___ Stop  Anti-inflammatories-NO NSAIDS OR ASA PRODUCTS-HYDROCODONE OK TO CONTINUE   ____ Stop supplements until after surgery.    ____ Bring C-Pap to the hospital.

## 2015-07-07 ENCOUNTER — Ambulatory Visit
Admission: RE | Admit: 2015-07-07 | Discharge: 2015-07-07 | Disposition: A | Discharge status: Home / Self Care | Payer: Self-pay | Source: Ambulatory Visit | Attending: Orthopedic Surgery | Admitting: Orthopedic Surgery

## 2015-07-07 ENCOUNTER — Encounter
Admission: RE | Disposition: A | Discharge status: Home / Self Care | Payer: Self-pay | Source: Ambulatory Visit | Attending: Orthopedic Surgery

## 2015-07-07 ENCOUNTER — Ambulatory Visit: Payer: Self-pay | Admitting: Anesthesiology

## 2015-07-07 ENCOUNTER — Encounter: Payer: Self-pay | Admitting: *Deleted

## 2015-07-07 DIAGNOSIS — W11XXXA Fall on and from ladder, initial encounter: Secondary | ICD-10-CM | POA: Insufficient documentation

## 2015-07-07 DIAGNOSIS — Z9889 Other specified postprocedural states: Secondary | ICD-10-CM

## 2015-07-07 DIAGNOSIS — Z8781 Personal history of (healed) traumatic fracture: Principal | ICD-10-CM

## 2015-07-07 DIAGNOSIS — Y939 Activity, unspecified: Secondary | ICD-10-CM | POA: Insufficient documentation

## 2015-07-07 DIAGNOSIS — F17200 Nicotine dependence, unspecified, uncomplicated: Secondary | ICD-10-CM | POA: Insufficient documentation

## 2015-07-07 DIAGNOSIS — N289 Disorder of kidney and ureter, unspecified: Secondary | ICD-10-CM | POA: Insufficient documentation

## 2015-07-07 DIAGNOSIS — S52572A Other intraarticular fracture of lower end of left radius, initial encounter for closed fracture: Secondary | ICD-10-CM | POA: Insufficient documentation

## 2015-07-07 DIAGNOSIS — Z79899 Other long term (current) drug therapy: Secondary | ICD-10-CM | POA: Insufficient documentation

## 2015-07-07 HISTORY — DX: Chronic kidney disease, unspecified: N18.9

## 2015-07-07 HISTORY — DX: Depression, unspecified: F32.A

## 2015-07-07 HISTORY — DX: Major depressive disorder, single episode, unspecified: F32.9

## 2015-07-07 HISTORY — PX: OPEN REDUCTION INTERNAL FIXATION (ORIF) DISTAL RADIAL FRACTURE: SHX5989

## 2015-07-07 SURGERY — OPEN REDUCTION INTERNAL FIXATION (ORIF) DISTAL RADIUS FRACTURE
Anesthesia: General | Site: Wrist | Laterality: Left | Wound class: Clean

## 2015-07-07 MED ORDER — PROPOFOL 10 MG/ML IV BOLUS
INTRAVENOUS | Status: DC | PRN
  Administered 2015-07-07: 150 mg via INTRAVENOUS

## 2015-07-07 MED ORDER — HYDROCODONE-ACETAMINOPHEN 5-325 MG PO TABS
ORAL_TABLET | ORAL | Status: AC
  Filled 2015-07-07: qty 1

## 2015-07-07 MED ORDER — FAMOTIDINE 20 MG PO TABS
ORAL_TABLET | ORAL | Status: AC
  Administered 2015-07-07: 20 mg via ORAL
  Filled 2015-07-07: qty 1

## 2015-07-07 MED ORDER — CEFAZOLIN SODIUM-DEXTROSE 2-4 GM/100ML-% IV SOLN
INTRAVENOUS | Status: AC
  Filled 2015-07-07: qty 100

## 2015-07-07 MED ORDER — METOCLOPRAMIDE HCL 10 MG PO TABS: 5.0000 mg | ORAL_TABLET | Freq: Three times a day (TID) | ORAL | Status: DC | PRN

## 2015-07-07 MED ORDER — FAMOTIDINE 20 MG PO TABS
20.0000 mg | ORAL_TABLET | Freq: Once | ORAL | Status: AC
  Administered 2015-07-07: 20 mg via ORAL

## 2015-07-07 MED ORDER — METOCLOPRAMIDE HCL 5 MG/ML IJ SOLN: 5.0000 mg | Freq: Three times a day (TID) | INTRAMUSCULAR | Status: DC | PRN

## 2015-07-07 MED ORDER — FENTANYL CITRATE (PF) 100 MCG/2ML IJ SOLN
25.0000 ug | INTRAMUSCULAR | Status: AC | PRN
  Administered 2015-07-07 (×6): 25 ug via INTRAVENOUS

## 2015-07-07 MED ORDER — FENTANYL CITRATE (PF) 100 MCG/2ML IJ SOLN
INTRAMUSCULAR | Status: DC | PRN
  Administered 2015-07-07 (×4): 50 ug via INTRAVENOUS

## 2015-07-07 MED ORDER — ONDANSETRON HCL 4 MG PO TABS: 4.0000 mg | ORAL_TABLET | Freq: Four times a day (QID) | ORAL | Status: DC | PRN

## 2015-07-07 MED ORDER — ONDANSETRON HCL 4 MG/2ML IJ SOLN: 4.0000 mg | Freq: Four times a day (QID) | INTRAMUSCULAR | Status: DC | PRN

## 2015-07-07 MED ORDER — MIDAZOLAM HCL 2 MG/2ML IJ SOLN
INTRAMUSCULAR | Status: DC | PRN
  Administered 2015-07-07: 2 mg via INTRAVENOUS

## 2015-07-07 MED ORDER — FENTANYL CITRATE (PF) 100 MCG/2ML IJ SOLN
INTRAMUSCULAR | Status: AC
  Filled 2015-07-07: qty 2

## 2015-07-07 MED ORDER — LACTATED RINGERS IV SOLN
INTRAVENOUS | Status: DC
  Administered 2015-07-07: 09:00:00 via INTRAVENOUS

## 2015-07-07 MED ORDER — DEXAMETHASONE SODIUM PHOSPHATE 10 MG/ML IJ SOLN
INTRAMUSCULAR | Status: DC | PRN
  Administered 2015-07-07: 10 mg via INTRAVENOUS

## 2015-07-07 MED ORDER — SODIUM CHLORIDE 0.9 % IV SOLN: INTRAVENOUS | Status: DC

## 2015-07-07 MED ORDER — HYDROCODONE-ACETAMINOPHEN 5-325 MG PO TABS: 1.0000 | ORAL_TABLET | Freq: Four times a day (QID) | ORAL | Status: DC | PRN

## 2015-07-07 MED ORDER — HYDROCODONE-ACETAMINOPHEN 5-325 MG PO TABS: 1.0000 | ORAL_TABLET | ORAL | Status: DC | PRN

## 2015-07-07 MED ORDER — ONDANSETRON HCL 4 MG/2ML IJ SOLN: 4.0000 mg | Freq: Once | INTRAMUSCULAR | Status: DC | PRN

## 2015-07-07 MED ORDER — CEFAZOLIN SODIUM-DEXTROSE 2-4 GM/100ML-% IV SOLN
2.0000 g | Freq: Once | INTRAVENOUS | Status: AC
  Administered 2015-07-07: 2 g via INTRAVENOUS

## 2015-07-07 MED ORDER — ONDANSETRON HCL 4 MG/2ML IJ SOLN
INTRAMUSCULAR | Status: DC | PRN
  Administered 2015-07-07: 4 mg via INTRAVENOUS

## 2015-07-07 MED ORDER — HYDROCODONE-ACETAMINOPHEN 5-325 MG PO TABS
1.0000 | ORAL_TABLET | Freq: Four times a day (QID) | ORAL | Status: DC | PRN
Start: 2015-07-07 — End: 2015-07-09
  Administered 2015-07-07: 1 via ORAL

## 2015-07-07 MED ORDER — NEOMYCIN-POLYMYXIN B GU 40-200000 IR SOLN
Status: DC | PRN
  Administered 2015-07-07: 2 mL

## 2015-07-07 SURGICAL SUPPLY — 44 items
BANDAGE ACE 4X5 VEL STRL LF (GAUZE/BANDAGES/DRESSINGS) ×3 IMPLANT
BIT DRILL 2 FAST STEP (BIT) ×3 IMPLANT
BIT DRILL 2.5X4 QC (BIT) ×3 IMPLANT
CANISTER SUCT 1200ML W/VALVE (MISCELLANEOUS) ×3 IMPLANT
CHLORAPREP W/TINT 26ML (MISCELLANEOUS) ×3 IMPLANT
CUFF TOURN 18 STER (MISCELLANEOUS) ×3 IMPLANT
DRAPE FLUOR MINI C-ARM 54X84 (DRAPES) ×3 IMPLANT
ELECT REM PT RETURN 9FT ADLT (ELECTROSURGICAL) ×3
ELECTRODE REM PT RTRN 9FT ADLT (ELECTROSURGICAL) ×1 IMPLANT
GAUZE PETRO XEROFOAM 1X8 (MISCELLANEOUS) ×6 IMPLANT
GAUZE SPONGE 4X4 12PLY STRL (GAUZE/BANDAGES/DRESSINGS) ×3 IMPLANT
GLOVE BIOGEL PI IND STRL 9 (GLOVE) ×1 IMPLANT
GLOVE BIOGEL PI INDICATOR 9 (GLOVE) ×2
GLOVE SURG ORTHO 9.0 STRL STRW (GLOVE) ×3 IMPLANT
GOWN SPECIALTY ULTRA XL (MISCELLANEOUS) ×3 IMPLANT
GOWN STRL REUS W/ TWL LRG LVL3 (GOWN DISPOSABLE) ×1 IMPLANT
GOWN STRL REUS W/TWL LRG LVL3 (GOWN DISPOSABLE) ×2
K-WIRE 1.6 (WIRE) ×2
K-WIRE FX5X1.6XNS BN SS (WIRE) ×1
KIT RM TURNOVER STRD PROC AR (KITS) ×3 IMPLANT
KWIRE FX5X1.6XNS BN SS (WIRE) ×1 IMPLANT
NEEDLE FILTER BLUNT 18X 1/2SAF (NEEDLE) ×2
NEEDLE FILTER BLUNT 18X1 1/2 (NEEDLE) ×1 IMPLANT
NS IRRIG 500ML POUR BTL (IV SOLUTION) ×3 IMPLANT
PACK EXTREMITY ARMC (MISCELLANEOUS) ×3 IMPLANT
PAD CAST CTTN 4X4 STRL (SOFTGOODS) ×2 IMPLANT
PADDING CAST COTTON 4X4 STRL (SOFTGOODS) ×4
PEG SUBCHONDRAL SMOOTH 2.0X16 (Peg) ×3 IMPLANT
PEG SUBCHONDRAL SMOOTH 2.0X18 (Peg) ×3 IMPLANT
PEG SUBCHONDRAL SMOOTH 2.0X20 (Peg) ×9 IMPLANT
PEG SUBCHONDRAL SMOOTH 2.0X22 (Peg) ×3 IMPLANT
PEG SUBCHONDRAL SMOOTH 2.0X24 (Peg) ×3 IMPLANT
PLATE SHORT 24.4X51.3 LT (Plate) ×3 IMPLANT
SCREW BN 12X3.5XNS CORT TI (Screw) ×1 IMPLANT
SCREW CORT 3.5X12 (Screw) ×2 IMPLANT
SCREW CORT 3.5X14 LNG (Screw) ×3 IMPLANT
SCREW CORT 3.5X16 LNG (Screw) ×3 IMPLANT
SPLINT CAST 1 STEP 3X12 (MISCELLANEOUS) ×3 IMPLANT
STOCKINETTE STRL 4IN 9604848 (GAUZE/BANDAGES/DRESSINGS) ×3 IMPLANT
SUT ETHILON 4-0 (SUTURE) ×2
SUT ETHILON 4-0 FS2 18XMFL BLK (SUTURE) ×1
SUT VICRYL 3-0 27IN (SUTURE) ×3 IMPLANT
SUTURE ETHLN 4-0 FS2 18XMF BLK (SUTURE) ×1 IMPLANT
SYR 3ML LL SCALE MARK (SYRINGE) ×3 IMPLANT

## 2015-07-07 NOTE — Transfer of Care (Signed)
Immediate Anesthesia Transfer of Care Note  Patient: Gerald RobsonJeffery T Struble Sr.  Procedure(s) Performed: Procedure(s): OPEN REDUCTION INTERNAL FIXATION (ORIF) DISTAL RADIAL FRACTURE (Left)  Patient Location: PACU  Anesthesia Type:General  Level of Consciousness: awake, alert  and oriented  Airway & Oxygen Therapy: Patient Spontanous Breathing and Patient connected to face mask oxygen  Post-op Assessment: Report given to RN and Post -op Vital signs reviewed and stable  Post vital signs: Reviewed and stable  Last Vitals:  Filed Vitals:   07/07/15 0851 07/07/15 1044  BP: 132/76 131/77  Pulse: 72   Temp: 36.8 C   Resp: 20 18    Last Pain:  Filed Vitals:   07/07/15 1045  PainSc: 7       Patients Stated Pain Goal: 2 (07/07/15 0851)  Complications: No apparent anesthesia complications

## 2015-07-07 NOTE — Discharge Instructions (Addendum)
Keep splint clean and dry. Work fingers is much as possible. Elevate hand to minimize swelling     For 24 hours: No driving, legal decisions, or alcoholic beveragesSpecific instructions  Follow-up Information    Please follow up.   Why:  In 4 days, already made          Follow-up Information    Please follow up.   Why:  In 4 days, already made     AMBULATORY SURGERY  DISCHARGE INSTRUCTIONS   1) The drugs that you were given will stay in your system until tomorrow so for the next 24 hours you should not:  A) Drive an automobile B) Make any legal decisions C) Drink any alcoholic beverage   2) You may resume regular meals tomorrow.  Today it is better to start with liquids and gradually work up to solid foods.  You may eat anything you prefer, but it is better to start with liquids, then soup and crackers, and gradually work up to solid foods.   3) Please notify your doctor immediately if you have any unusual bleeding, trouble breathing, redness and pain at the surgery site, drainage, fever, or pain not relieved by medication.    4) Additional Instructions:

## 2015-07-07 NOTE — Anesthesia Preprocedure Evaluation (Signed)
Anesthesia Evaluation  Patient identified by MRN, date of birth, ID band Patient awake    Reviewed: Allergy & Precautions, NPO status , Patient's Chart, lab work & pertinent test results  Airway Mallampati: II  TM Distance: >3 FB     Dental  (+) Poor Dentition, Loose   Pulmonary Current Smoker,  Heavy smoker   Pulmonary exam normal        Cardiovascular negative cardio ROS Normal cardiovascular exam     Neuro/Psych PSYCHIATRIC DISORDERS Depression negative neurological ROS     GI/Hepatic negative GI ROS, Neg liver ROS,   Endo/Other  negative endocrine ROS  Renal/GU Renal Insufficiency  negative genitourinary   Musculoskeletal negative musculoskeletal ROS (+)   Abdominal Normal abdominal exam  (+)   Peds negative pediatric ROS (+)  Hematology negative hematology ROS (+)   Anesthesia Other Findings   Reproductive/Obstetrics                             Anesthesia Physical Anesthesia Plan  ASA: III  Anesthesia Plan: General   Post-op Pain Management:    Induction: Intravenous  Airway Management Planned: LMA  Additional Equipment:   Intra-op Plan:   Post-operative Plan: Extubation in OR  Informed Consent: I have reviewed the patients History and Physical, chart, labs and discussed the procedure including the risks, benefits and alternatives for the proposed anesthesia with the patient or authorized representative who has indicated his/her understanding and acceptance.   Dental advisory given  Plan Discussed with: CRNA and Surgeon  Anesthesia Plan Comments:         Anesthesia Quick Evaluation

## 2015-07-07 NOTE — Op Note (Signed)
07/07/2015  10:41 AM  PATIENT:  Gerald RobsonJeffery T Wichmann Sr.  45 y.o. male  PRE-OPERATIVE DIAGNOSIS:  COMMINUTED INTRA-ARTICULAR CLOSED FRACTURE LEFT DISTAL RADIUS   POST-OPERATIVE DIAGNOSIS:  COMMINUTED INTRA-ARTICULAR CLOSED FRACTURE LEFT DISTAL RADIUS   PROCEDURE:  Procedure(s): OPEN REDUCTION INTERNAL FIXATION (ORIF) DISTAL RADIAL FRACTURE (Left)  SURGEON: Leitha SchullerMichael J Viviene Thurston, MD  ASSISTANTS: None  ANESTHESIA:   general  EBL:  Total I/O In: 600 [I.V.:600] Out: 10 [Blood:10]  BLOOD ADMINISTERED:none  DRAINS: none   LOCAL MEDICATIONS USED:  NONE  SPECIMEN:  No Specimen  DISPOSITION OF SPECIMEN:  N/A  COUNTS:  YES  TOURNIQUET:    IMPLANTS: [employer] Bi[employer] short standard plate with multiple screws and smooth pegs  DICTATION: .Dragon Dictation patient brought the operating room and after adequate general anesthesia was obtained left arm was prepped and draped in sterile fashion. After patient identification and timeout procedures were completed, fingertrap traction was applied and 10 pounds of traction applied through the index and middle fingers. Tourniquet was raised to 2 her 50 murmurs mercury and a volar approach is made over the FCR tendon. The tendon sheath was incised and the tendon retracted radially with the radial artery and associated veins protected. The pronator was elevated off the fracture and the fracture site exposed and fracture manipulated to get better reduction. There was dorsal and injury which would not correct. The distal fragment was then recut partially reduced by application of the plate and fixing the 3 cortical screws this helped reduce the distal fragment. The smooth pegs were then filled sequentially drilling measuring and placing the smooth pegs reduction of the joint appeared anatomic the wound was thoroughly irrigated and tourniquet let down there is no significant bleeding. Wound was closed with 3-0 Vicryl subcutaneously and 4-0 nylon for the skin.  Xeroform 4 x 4 volar splint web after web roll and a Ace wrap applied  PLAN OF CARE: Discharge to home after PACU  PATIENT DISPOSITION:  PACU - hemodynamically stable.

## 2015-07-07 NOTE — Anesthesia Procedure Notes (Signed)
Procedure Name: LMA Insertion Date/Time: 07/07/2015 9:29 AM Performed by: Omer JackWEATHERLY, Emmersen Garraway Pre-anesthesia Checklist: Patient identified, Patient being monitored, Timeout performed, Emergency Drugs available and Suction available Patient Re-evaluated:Patient Re-evaluated prior to inductionOxygen Delivery Method: Circle system utilized Preoxygenation: Pre-oxygenation with 100% oxygen Intubation Type: IV induction Ventilation: Mask ventilation without difficulty LMA: LMA inserted LMA Size: 4.5 Tube type: Oral Number of attempts: 1 Placement Confirmation: positive ETCO2 and breath sounds checked- equal and bilateral Tube secured with: Tape Dental Injury: Teeth and Oropharynx as per pre-operative assessment

## 2015-07-07 NOTE — H&P (Signed)
Reviewed paper H+P, will be scanned into chart. No changes noted.  

## 2015-07-10 NOTE — Anesthesia Postprocedure Evaluation (Signed)
Anesthesia Post Note  Patient: Gerald RobsonJeffery T Constantine Sr.  Procedure(s) Performed: Procedure(s) (LRB): OPEN REDUCTION INTERNAL FIXATION (ORIF) DISTAL RADIAL FRACTURE (Left)  Patient location during evaluation: PACU Anesthesia Type: General Level of consciousness: awake and alert and oriented Pain management: pain level controlled Vital Signs Assessment: post-procedure vital signs reviewed and stable Respiratory status: spontaneous breathing Cardiovascular status: blood pressure returned to baseline Anesthetic complications: no    Last Vitals:  Filed Vitals:   07/07/15 1155 07/07/15 1215  BP: 133/69 142/68  Pulse: 69 63  Temp:    Resp:      Last Pain:  Filed Vitals:   07/07/15 1215  PainSc: 3                  Kmarion Rawl

## 2016-04-01 ENCOUNTER — Emergency Department
Admission: EM | Admit: 2016-04-01 | Discharge: 2016-04-01 | Disposition: A | Discharge status: Home / Self Care | Payer: BLUE CROSS/BLUE SHIELD | Attending: Emergency Medicine | Admitting: Emergency Medicine

## 2016-04-01 DIAGNOSIS — Z79899 Other long term (current) drug therapy: Secondary | ICD-10-CM | POA: Insufficient documentation

## 2016-04-01 DIAGNOSIS — F17210 Nicotine dependence, cigarettes, uncomplicated: Secondary | ICD-10-CM | POA: Diagnosis not present

## 2016-04-01 DIAGNOSIS — Y9301 Activity, walking, marching and hiking: Secondary | ICD-10-CM | POA: Insufficient documentation

## 2016-04-01 DIAGNOSIS — S79921A Unspecified injury of right thigh, initial encounter: Secondary | ICD-10-CM | POA: Diagnosis present

## 2016-04-01 DIAGNOSIS — Y999 Unspecified external cause status: Secondary | ICD-10-CM | POA: Insufficient documentation

## 2016-04-01 DIAGNOSIS — Y92007 Garden or yard of unspecified non-institutional (private) residence as the place of occurrence of the external cause: Secondary | ICD-10-CM | POA: Insufficient documentation

## 2016-04-01 DIAGNOSIS — W1839XA Other fall on same level, initial encounter: Secondary | ICD-10-CM | POA: Diagnosis not present

## 2016-04-01 DIAGNOSIS — N189 Chronic kidney disease, unspecified: Secondary | ICD-10-CM | POA: Insufficient documentation

## 2016-04-01 DIAGNOSIS — M5431 Sciatica, right side: Secondary | ICD-10-CM

## 2016-04-01 DIAGNOSIS — S7011XA Contusion of right thigh, initial encounter: Secondary | ICD-10-CM

## 2016-04-01 DIAGNOSIS — S76311A Strain of muscle, fascia and tendon of the posterior muscle group at thigh level, right thigh, initial encounter: Secondary | ICD-10-CM | POA: Diagnosis not present

## 2016-04-01 MED ORDER — KETOROLAC TROMETHAMINE 60 MG/2ML IM SOLN
30.0000 mg | Freq: Once | INTRAMUSCULAR | Status: AC
  Administered 2016-04-01: 30 mg via INTRAMUSCULAR
  Filled 2016-04-01: qty 2

## 2016-04-01 MED ORDER — CYCLOBENZAPRINE HCL 5 MG PO TABS: 5.0000 mg | ORAL_TABLET | Freq: Three times a day (TID) | ORAL | 0 refills | Status: DC | PRN

## 2016-04-01 MED ORDER — NAPROXEN SODIUM 550 MG PO TABS: 550.0000 mg | ORAL_TABLET | Freq: Two times a day (BID) | ORAL | 0 refills | Status: DC

## 2016-04-01 MED ORDER — ORPHENADRINE CITRATE 30 MG/ML IJ SOLN
60.0000 mg | INTRAMUSCULAR | Status: AC
  Administered 2016-04-01: 60 mg via INTRAMUSCULAR
  Filled 2016-04-01: qty 2

## 2016-04-01 NOTE — ED Triage Notes (Signed)
Pt states that a few nights ago he was walking really fast in the dark and ran into a board, pt states that the pain initially was in his rt thigh but the pain is now in his rt buttock, pt states that the pain is interrupting his sleep and ability to walk well

## 2016-04-01 NOTE — ED Notes (Signed)

## 2016-04-01 NOTE — ED Notes (Signed)
Pt states approx 3-4 days ago he was walking in the dark and hit his upper thigh on a wooden board. Pt c/o some soreness the next day but states "the pain moved and it feels like someone is poking me in the ass cheek with a hot poker". Pt goes on to explain that pain radiates down the back of his R leg at this time.

## 2016-04-01 NOTE — ED Provider Notes (Signed)
[facility] Emergency Department Provider Note ____________________________________________  Time seen: 1645  I have reviewed the triage vital signs and the nursing notes.  HISTORY  Chief Complaint  Leg Pain   HPI Gerald GRUNEWALD Sr. is a 46 y.o. male visits to the ED for evaluation of pain to the right lower extremity. He describes walking across his yard in the dark, when he ran into a board that he had laid across two benches, in the yard. He may contact with the boardat the right anterior thigh, and nearly fell forward over the board. He denies any significant pain to the anterior thigh. He reports pain now aggravating his posterior thigh with referral down the leg. He denies an outright fall, incontinence, or foot drop. He recalls a history of sciatica in the pass. He has dosed Aleve with limited benefit.   Past Medical History:  Diagnosis Date  . Chronic kidney disease    H/O STONES  . Depression     There are no active problems to display for this patient.   Past Surgical History:  Procedure Laterality Date  . ARM SURGERY    . KNEE SURGERY    . OPEN REDUCTION INTERNAL FIXATION (ORIF) DISTAL RADIAL FRACTURE Left 07/07/2015   Procedure: OPEN REDUCTION INTERNAL FIXATION (ORIF) DISTAL RADIAL FRACTURE;  Surgeon: Kennedy Bucker, MD;  Location: [facility] ORS;  Service: Orthopedics;  Laterality: Left;    Prior to Admission medications   Medication Sig Start Date End Date Taking? Authorizing Provider  cyclobenzaprine (FLEXERIL) 5 MG tablet Take 1 tablet (5 mg total) by mouth 3 (three) times daily as needed for muscle spasms. 04/01/16   Timi Reeser V Bacon Jessic Standifer, PA-C  HYDROcodone-acetaminophen (NORCO) 5-325 MG per tablet Take 1-2 tablets by mouth every 4 (four) hours as needed for moderate pain. 10/28/14   Evangeline Dakin, PA-C  HYDROcodone-acetaminophen (NORCO) 5-325 MG tablet Take 1 tablet by mouth every 6 (six) hours as needed for moderate pain. 07/07/15   Kennedy Bucker, MD  naproxen sodium (ANAPROX) 550 MG tablet Take 1 tablet (550 mg total) by mouth 2 (two) times daily with a meal. 04/01/16   Charlesetta Ivory Aarionna Germer, PA-C    Allergies Patient has no known allergies.  No family history on file.  Social History Social History  Substance Use Topics  . Smoking status: Current Every Day Smoker    Packs/day: 2.00    Years: 20.00    Types: Cigarettes  . Smokeless tobacco: Not on file  . Alcohol use Yes     Comment: occasionally    Review of Systems  Constitutional: Negative for fever. Cardiovascular: Negative for chest pain. Respiratory: Negative for shortness of breath. Gastrointestinal: Negative for abdominal pain, vomiting and diarrhea. Genitourinary: Negative for dysuria. Musculoskeletal: Negative for back pain. RLE thigh pain as above.  Skin: Negative for rash. Neurological: Negative for headaches, focal weakness or numbness. ____________________________________________  PHYSICAL EXAM:  VITAL SIGNS: ED Triage Vitals  Enc Vitals Group     BP 04/01/16 1532 (!) 141/79     Pulse Rate 04/01/16 1532 (!) 58     Resp 04/01/16 1532 18     Temp 04/01/16 1532 97.8 F (36.6 C)     Temp Source 04/01/16 1532 Oral     SpO2 04/01/16 1532 99 %     Weight 04/01/16 1533 185 lb (83.9 kg)     Height 04/01/16 1533 5\' 11"  (1.803 m)     Head Circumference --  Peak Flow --      Pain Score 04/01/16 1534 10     Pain Loc --      Pain Edu? --      Excl. in GC? --     Constitutional: Alert and oriented. Well appearing and in no distress. Head: Normocephalic and atraumatic. Cardiovascular: Normal rate, regular rhythm. Normal distal pulses. Respiratory: Normal respiratory effort. No wheezes/rales/rhonchi. Musculoskeletal: Normal spinal alignment without midline tenderness, spasm, deformity, step-off. Patient with great discomfort to palpation over the right SI joint posteriorly. He has no anterior thigh bruising, hematoma, or rigidity. Full active  range of motion of the lower extremity with normal hamstring and quadriceps strength. Nontender with normal range of motion in all extremities.  Neurologic: CN II-XII grossly intact. Normal LE DTRs bilaterally.  Normal gait without ataxia. Normal speech and language. No gross focal neurologic deficits are appreciated. Skin:  Skin is warm, dry and intact. No rash noted. ____________________________________________  PROCEDURES  Toradol 30 mg IM Norflex 60 mg IM ____________________________________________  INITIAL IMPRESSION / ASSESSMENT AND PLAN / ED COURSE  Patient with right anterior thigh contusion and posterior thigh strain causing some sciatic irritation. Exam is otherwise benign in presentation. No obvious bruising, deformity, or ecchymosis noted to the anterior right thigh. Full active range of motion of the right lower extremity with subjective paresthesias. He is discharged at this time with prescription for Naproxen and Flexeril doses directed. He will follow up with his primary care provider or return to the ED as needed. ____________________________________________  FINAL CLINICAL IMPRESSION(S) / ED DIAGNOSES  Final diagnoses:  Sciatica of right side  Contusion of anterior thigh, right, initial encounter     Lissa HoardJenise V Bacon Arvetta Araque, PA-C 04/01/16 1725    Sharman CheekPhillip Stafford, MD 04/01/16 2040

## 2016-04-01 NOTE — Discharge Instructions (Signed)
Your exam is consistent with quadriceps contusion and sciatic irritation. Take the prescription meds as directed. Apply ice to the sciatic nerve. Follow-up with your provider for continued symptoms.

## 2017-08-06 IMAGING — DX DG ELBOW COMPLETE 3+V*L*
4 series · 4 of 4 positions shown · non-contrast
Comparison: None.

CLINICAL DATA: Left elbow pain after fall

EXAM:
LEFT ELBOW - COMPLETE 3+ VIEW

[elbow ap]
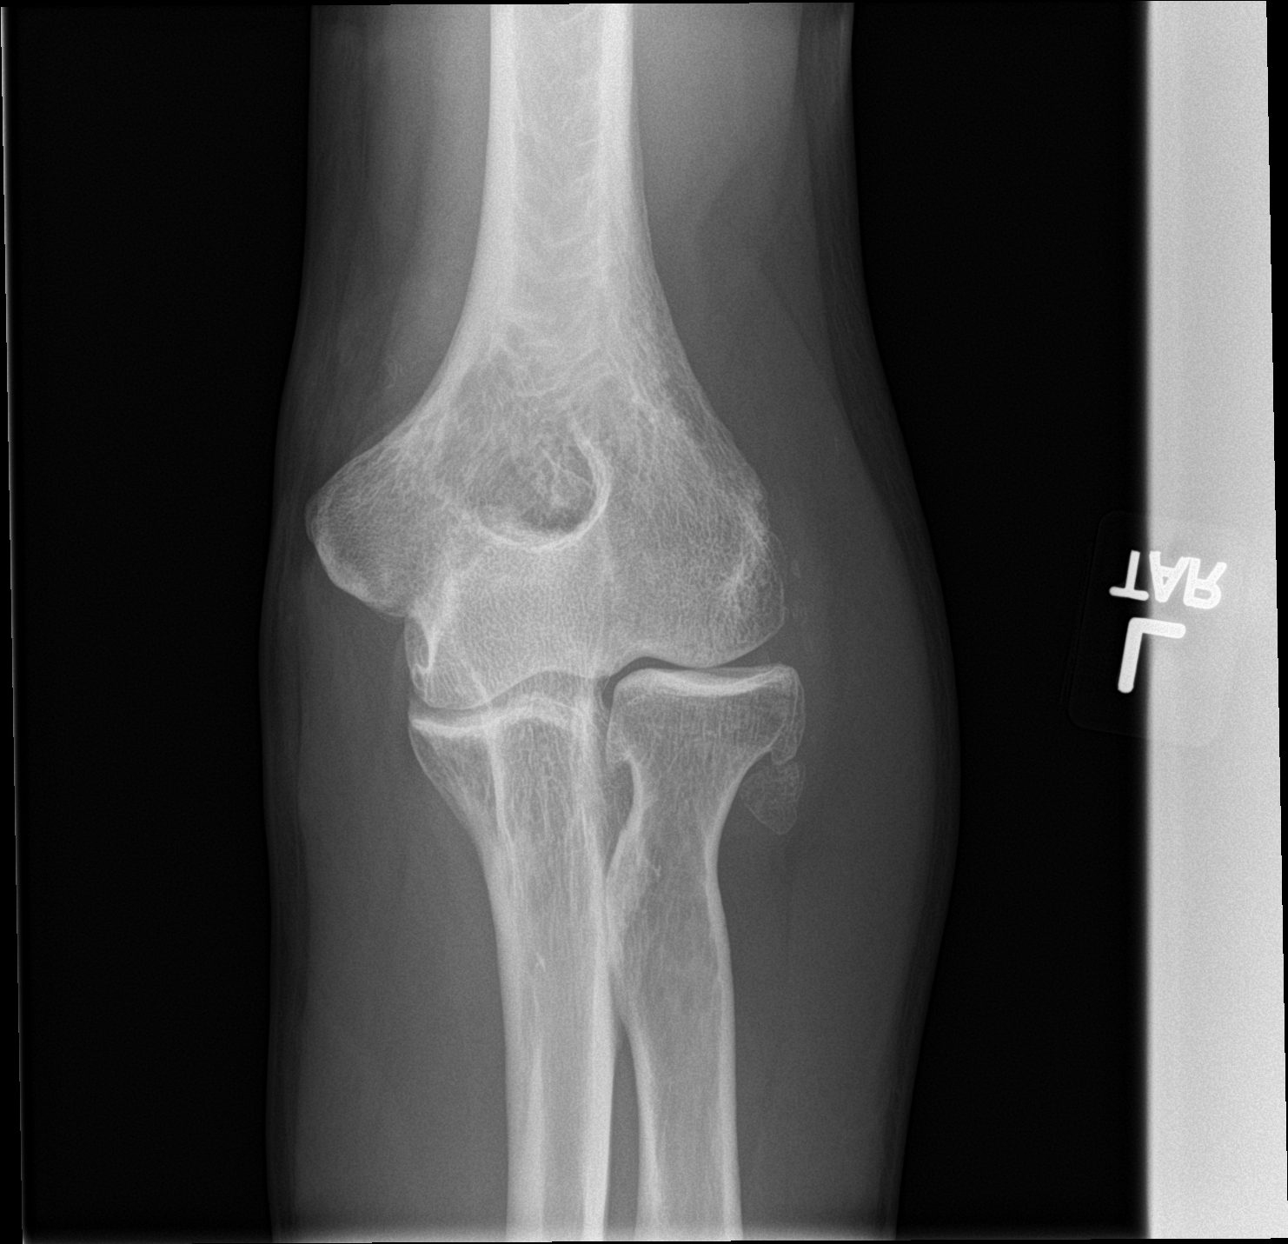

[elbow obl (1 of 2)]
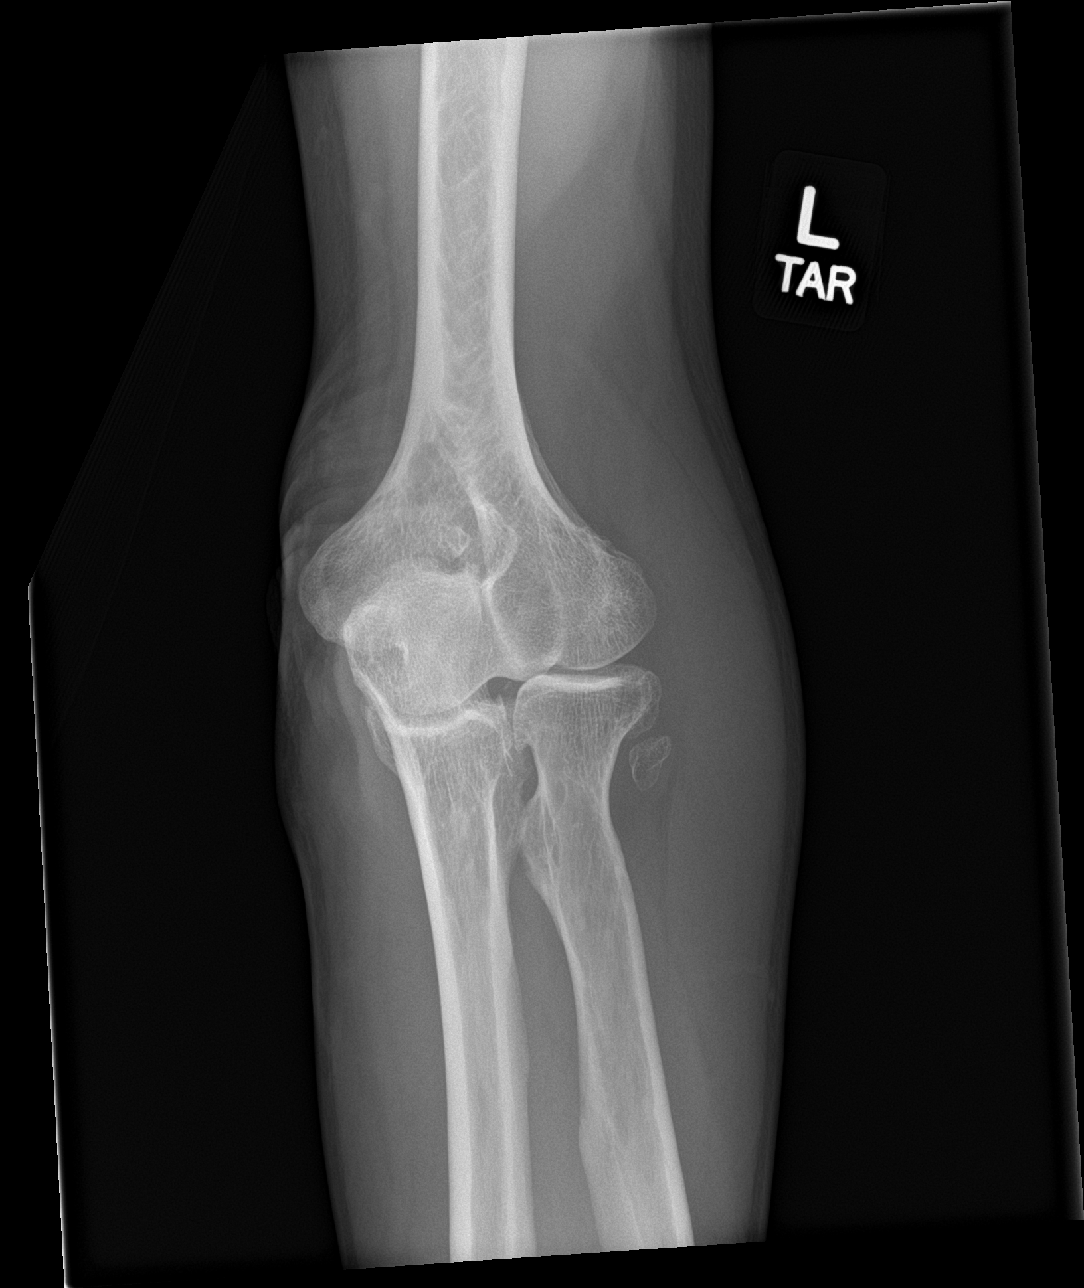

[elbow lat]
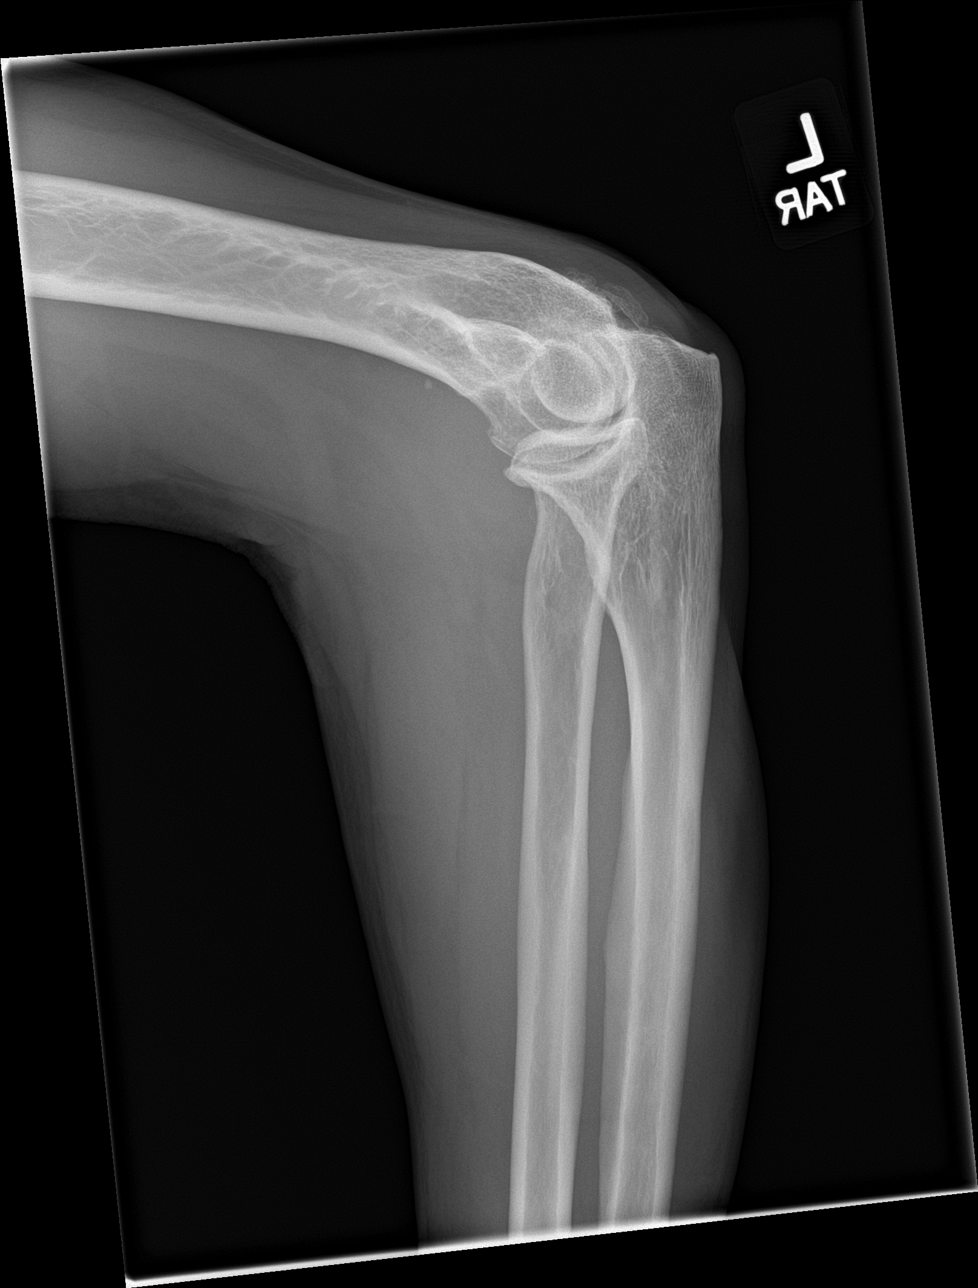

[elbow obl (2 of 2)]
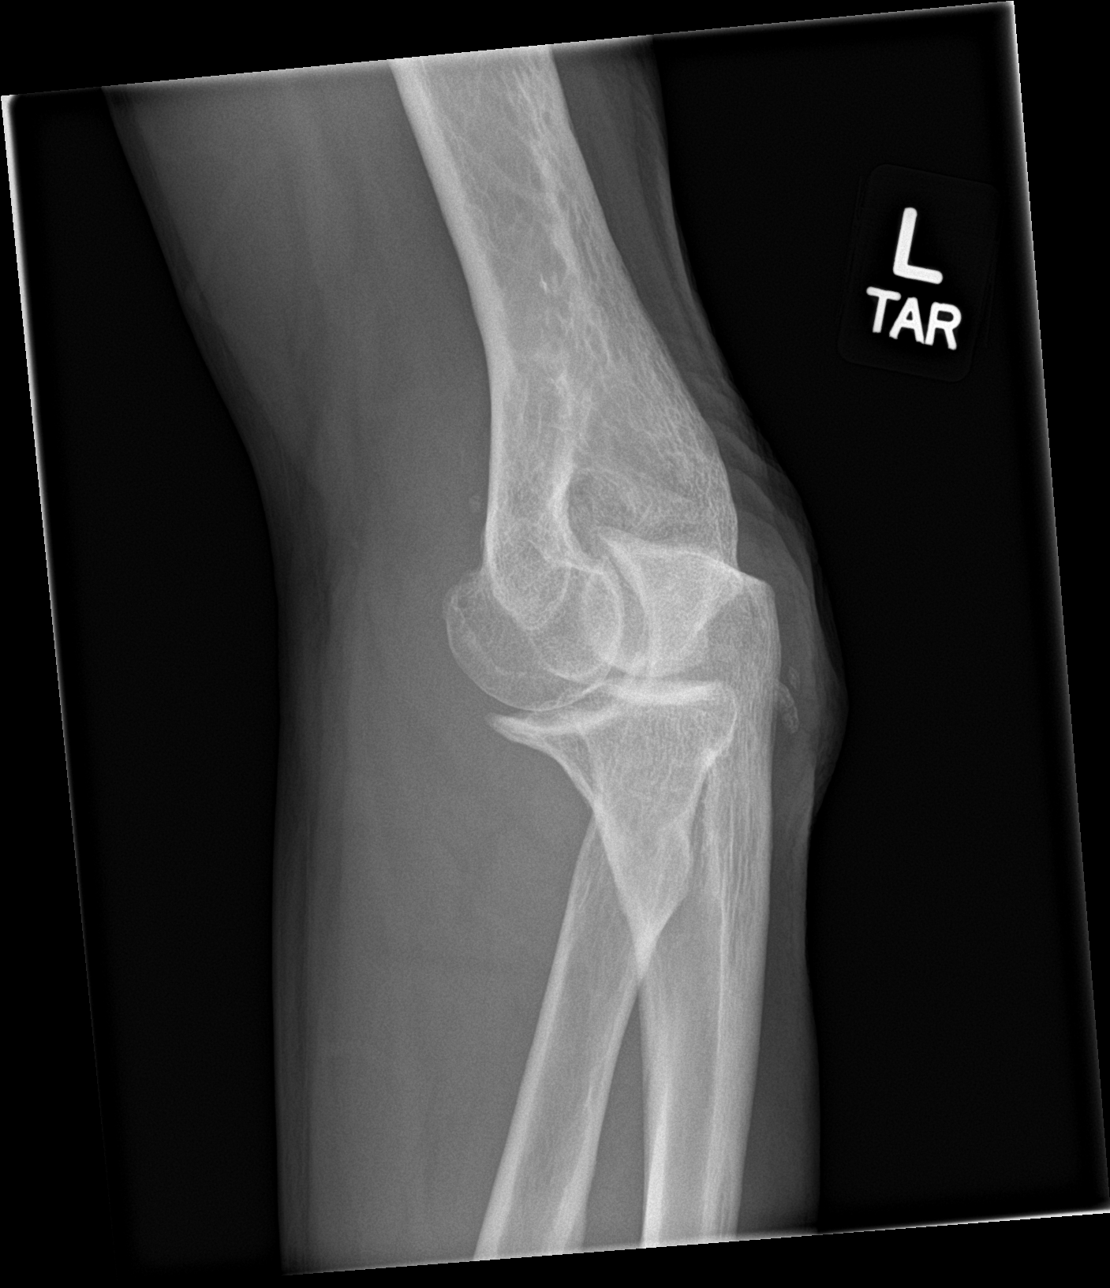

[4 of 4 positions shown; findings below may reference images not displayed]

FINDINGS: No fracture, joint effusion, dislocation or suspicious focal osseous
lesion. There are tiny marginal osteophytes at the radiocapitellar
joint. There are several small enthesophytes about the left elbow.
IMPRESSION: No fracture, joint effusion or malalignment in the left elbow. Mild
degenerative changes as described.

## 2017-08-06 IMAGING — DX DG WRIST COMPLETE 3+V*L*
4 series · 4 of 4 positions shown · non-contrast
Comparison: None.

CLINICAL DATA: Fall off ladder, left wrist pain and swelling

EXAM:
LEFT WRIST - COMPLETE 3+ VIEW

[wrist ap (1 of 2)]
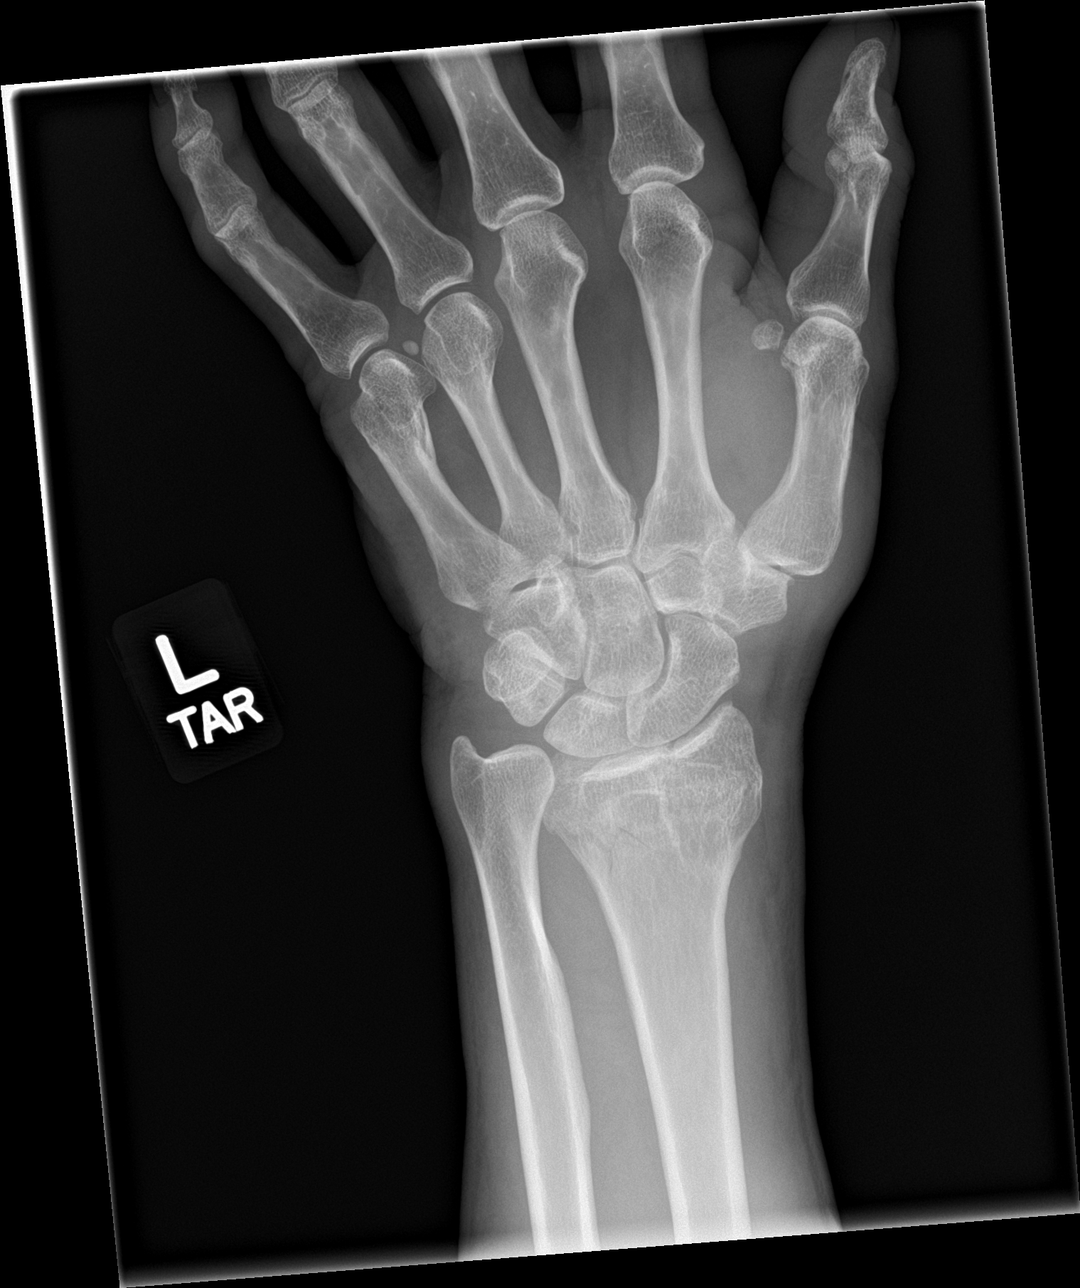

[wrist obl]
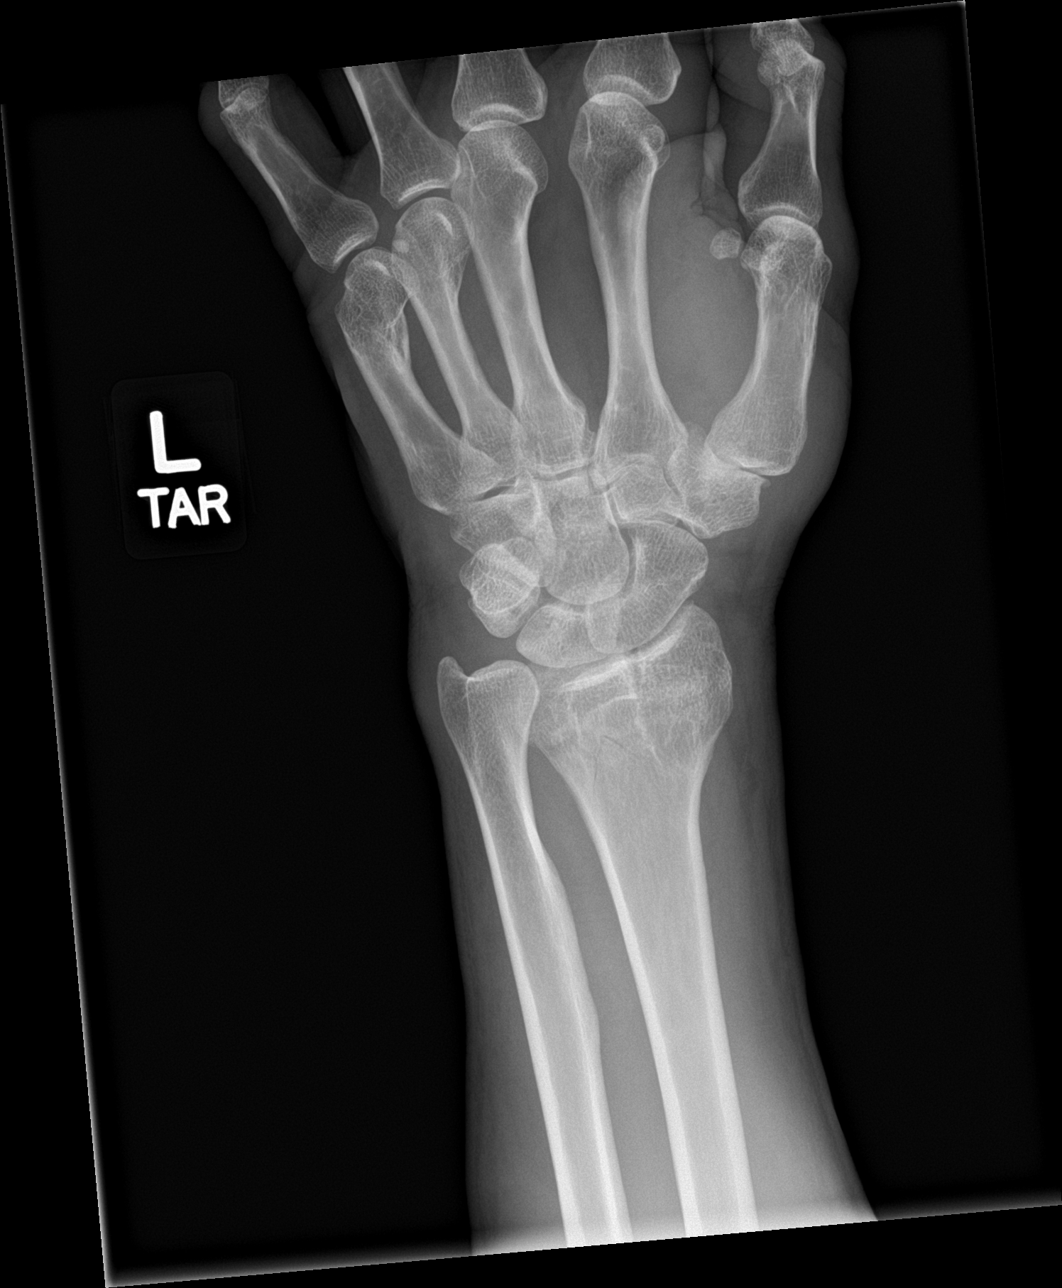

[wrist lat]
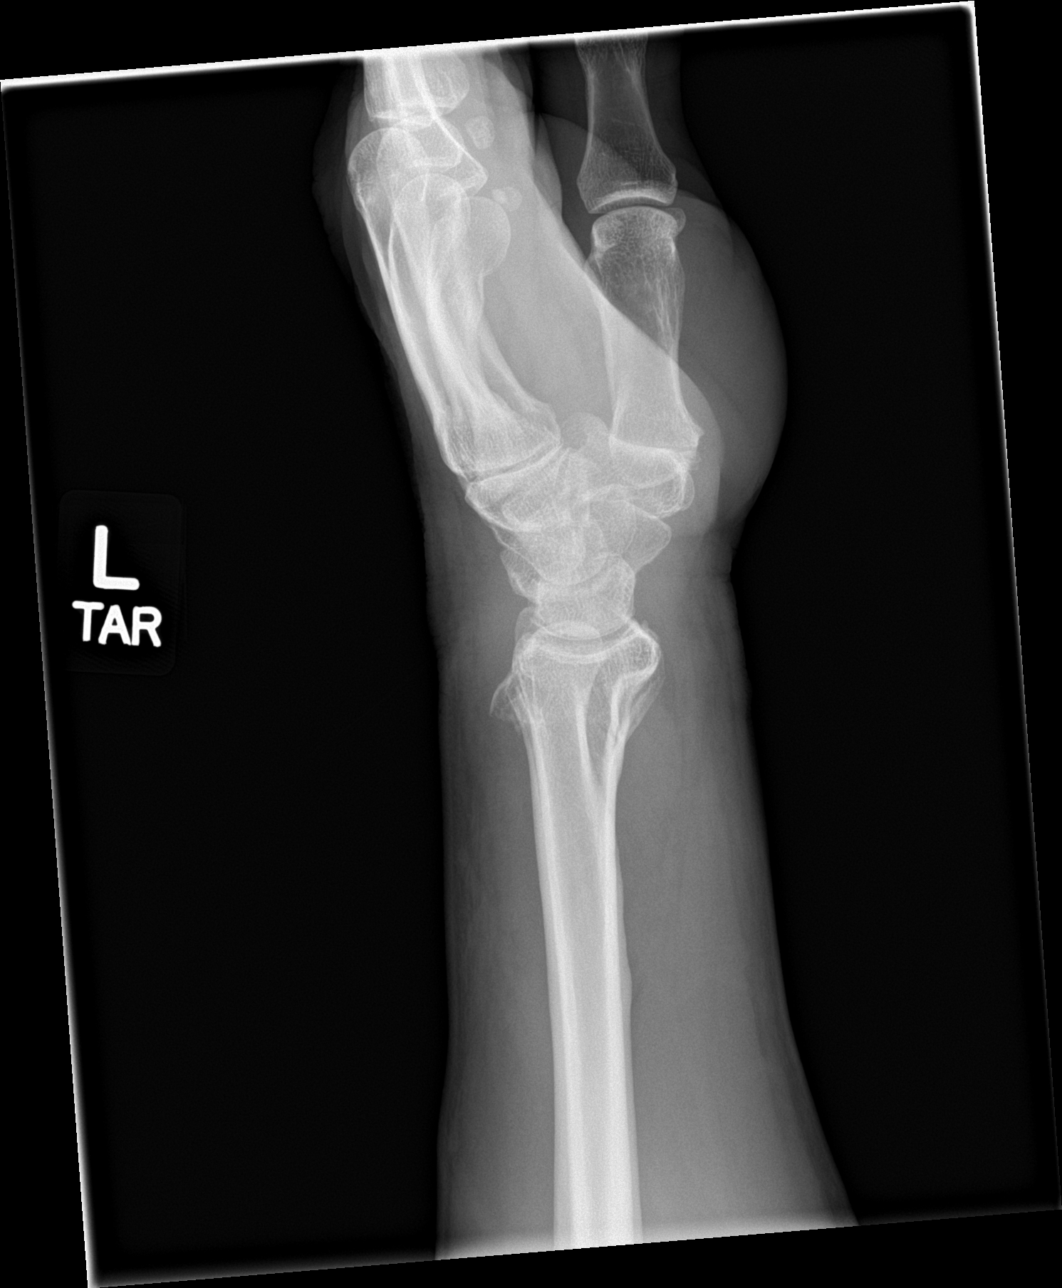

[wrist ap (2 of 2)]
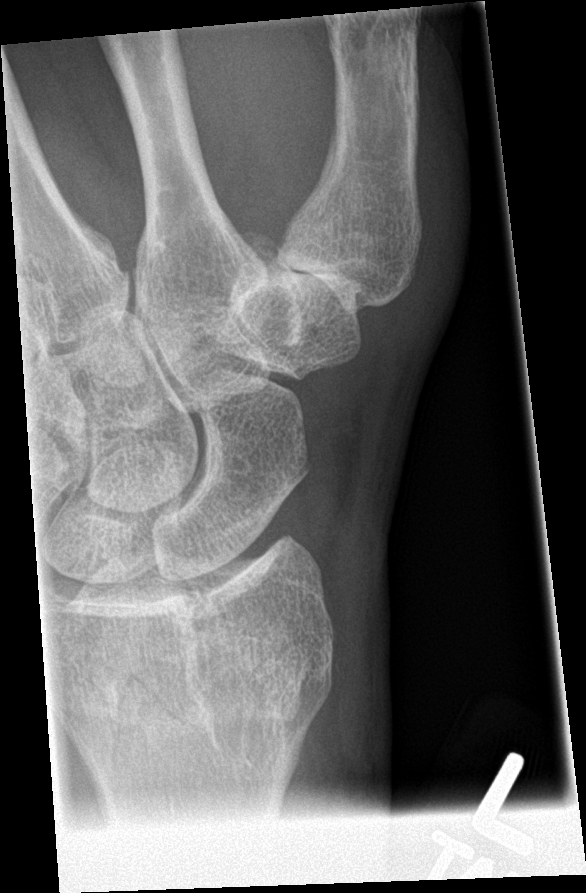

[4 of 4 positions shown; findings below may reference images not displayed]

FINDINGS: Comminuted, impacted distal radial fracture with intra-articular
extension.

No additional fracture is seen.

Visualized soft tissues are within normal limits.
IMPRESSION: Comminuted, impacted distal radial fracture with intra-articular
extension.

## 2020-01-18 ENCOUNTER — Other Ambulatory Visit: Payer: Self-pay

## 2020-01-18 ENCOUNTER — Emergency Department
Admission: EM | Admit: 2020-01-18 | Discharge: 2020-01-18 | Disposition: A | Discharge status: Home / Self Care | Payer: BLUE CROSS/BLUE SHIELD | Attending: Emergency Medicine | Admitting: Emergency Medicine

## 2020-01-18 ENCOUNTER — Encounter: Payer: Self-pay | Admitting: Emergency Medicine

## 2020-01-18 DIAGNOSIS — F17210 Nicotine dependence, cigarettes, uncomplicated: Secondary | ICD-10-CM | POA: Insufficient documentation

## 2020-01-18 DIAGNOSIS — L03114 Cellulitis of left upper limb: Secondary | ICD-10-CM | POA: Insufficient documentation

## 2020-01-18 DIAGNOSIS — L02512 Cutaneous abscess of left hand: Secondary | ICD-10-CM | POA: Insufficient documentation

## 2020-01-18 DIAGNOSIS — L03119 Cellulitis of unspecified part of limb: Secondary | ICD-10-CM

## 2020-01-18 DIAGNOSIS — L03113 Cellulitis of right upper limb: Secondary | ICD-10-CM | POA: Insufficient documentation

## 2020-01-18 DIAGNOSIS — N189 Chronic kidney disease, unspecified: Secondary | ICD-10-CM | POA: Insufficient documentation

## 2020-01-18 HISTORY — DX: Unspecified glaucoma: H40.9

## 2020-01-18 MED ORDER — CLINDAMYCIN PHOSPHATE 300 MG/2ML IJ SOLN
600.0000 mg | Freq: Once | INTRAMUSCULAR | Status: AC
  Administered 2020-01-18: 600 mg via INTRAMUSCULAR
  Filled 2020-01-18: qty 4

## 2020-01-18 MED ORDER — CLINDAMYCIN PHOSPHATE 600 MG/4ML IJ SOLN: 600.0000 mg | Freq: Once | INTRAMUSCULAR | Status: DC

## 2020-01-18 MED ORDER — CLINDAMYCIN HCL 300 MG PO CAPS: 300.0000 mg | ORAL_CAPSULE | Freq: Four times a day (QID) | ORAL | 0 refills | Status: DC

## 2020-01-18 NOTE — ED Triage Notes (Signed)
Pt to ED c/o abscesses to bilateral hands for several weeks.  States some drainage, wounds closed at this time, denies fevers.

## 2020-01-18 NOTE — ED Notes (Signed)
Pt with multiple scabbed lesions to b/l hands;purulence noted to some

## 2020-01-18 NOTE — ED Provider Notes (Signed)
[facility] Emergency Department Provider Note  ____________________________________________  Time seen: Approximately 9:40 PM  I have reviewed the triage vital signs and the nursing notes.   HISTORY  Chief Complaint Abscess    HPI Gerald [name] Sr. is a 49 y.o. male who presents the emergency department concern for possible abscesses to both hands.  Patient is an IV drug user, has been sharing needles with family members.  Patient had a few small erythematous lesions to both hands, no extension into the forearms.  No fever or chills.  No systemic complaints.  No medications prior to arrival.         Past Medical History:  Diagnosis Date   Chronic kidney disease    H/O STONES   Depression    Glaucoma     There are no problems to display for this patient.   Past Surgical History:  Procedure Laterality Date   ARM SURGERY     KNEE SURGERY     OPEN REDUCTION INTERNAL FIXATION (ORIF) DISTAL RADIAL FRACTURE Left 07/07/2015   Procedure: OPEN REDUCTION INTERNAL FIXATION (ORIF) DISTAL RADIAL FRACTURE;  Surgeon: Kennedy Bucker, MD;  Location: [facility] ORS;  Service: Orthopedics;  Laterality: Left;    Prior to Admission medications   Medication Sig Start Date End Date Taking? Authorizing Provider  clindamycin (CLEOCIN) 300 MG capsule Take 1 capsule (300 mg total) by mouth 4 (four) times daily. 01/18/20   [place] Dalporto, Delorise Royals, PA-C  cyclobenzaprine (FLEXERIL) 5 MG tablet Take 1 tablet (5 mg total) by mouth 3 (three) times daily as needed for muscle spasms. 04/01/16   Menshew, Charlesetta Ivory, PA-C  HYDROcodone-acetaminophen (NORCO) 5-325 MG per tablet Take 1-2 tablets by mouth every 4 (four) hours as needed for moderate pain. 10/28/14   Beers, Charmayne Sheer, PA-C  HYDROcodone-acetaminophen (NORCO) 5-325 MG tablet Take 1 tablet by mouth every 6 (six) hours as needed for moderate pain. 07/07/15   Kennedy Bucker, MD  naproxen sodium (ANAPROX) 550 MG tablet Take 1  tablet (550 mg total) by mouth 2 (two) times daily with a meal. 04/01/16   Menshew, Charlesetta Ivory, PA-C    Allergies Patient has no known allergies.  History reviewed. No pertinent family history.  Social History Social History   Tobacco Use   Smoking status: Current Every Day Smoker    Packs/day: 2.00    Years: 20.00    Pack years: 40.00    Types: Cigarettes   Smokeless tobacco: Never Used  Substance Use Topics   Alcohol use: Yes    Comment: occasionally   Drug use: No     Review of Systems  Constitutional: No fever/chills Eyes: No visual changes. No discharge ENT: No upper respiratory complaints. Cardiovascular: no chest pain. Respiratory: no cough. No SOB. Gastrointestinal: No abdominal pain.  No nausea, no vomiting.  No diarrhea.  No constipation. Musculoskeletal: Negative for musculoskeletal pain. Skin: Possible abscesses to both hands from IV drug use Neurological: Negative for headaches, focal weakness or numbness.  10 System ROS otherwise negative.  ____________________________________________   PHYSICAL EXAM:  VITAL SIGNS: ED Triage Vitals  Enc Vitals Group     BP 01/18/20 1908 139/76     Pulse Rate 01/18/20 1908 76     Resp 01/18/20 1908 (!) 22     Temp 01/18/20 1908 98 F (36.7 C)     Temp Source 01/18/20 1908 Oral     SpO2 01/18/20 1908 99 %     Weight 01/18/20 1908 180  lb (81.6 kg)     Height 01/18/20 1908 5\' 9"  (1.753 m)     Head Circumference --      Peak Flow --      Pain Score 01/18/20 1916 8     Pain Loc --      Pain Edu? --      Excl. in GC? --      Constitutional: Alert and oriented. Well appearing and in no acute distress. Eyes: Conjunctivae are normal. PERRL. EOMI. Head: Atraumatic. ENT:      Ears:       Nose: No congestion/rhinnorhea.      Mouth/Throat: Mucous membranes are moist.  Neck: No stridor.    Cardiovascular: Normal rate, regular rhythm. Normal S1 and S2.  Good peripheral circulation. Respiratory: Normal  respiratory effort without tachypnea or retractions. Lungs CTAB. Good air entry to the bases with no decreased or absent breath sounds. Musculoskeletal: Full range of motion to all extremities. No gross deformities appreciated. Neurologic:  Normal speech and language. No gross focal neurologic deficits are appreciated.  Skin:  Skin is warm, dry and intact.  Visualization of both hands reveals small erythematous lesions consistent with cellulitis with no fluctuance or induration.  Patient with tenderness over these lesions but no purulent drainage.  Full range of motion to wrist, all digits of both hands.  Sensation capillary refill intact all digits. Psychiatric: Mood and affect are normal. Speech and behavior are normal. Patient exhibits appropriate insight and judgement.   ____________________________________________   LABS (all labs ordered are listed, but only abnormal results are displayed)  Labs Reviewed - No data to display ____________________________________________  EKG   ____________________________________________  RADIOLOGY   No results found.  ____________________________________________    PROCEDURES  Procedure(s) performed:    Procedures    Medications  clindamycin (CLEOCIN) injection 600 mg (600 mg Intramuscular Given 01/18/20 2205)     ____________________________________________   INITIAL IMPRESSION / ASSESSMENT AND PLAN / ED COURSE  Pertinent labs & imaging results that were available during my care of the patient were reviewed by me and considered in my medical decision making (see chart for details).  Review of the [place] CSRS was performed in accordance of the [facility] prior to dispensing any controlled drugs.           Patient's diagnosis is consistent with cellulitis of both hands.  Patient presented to emergency department with erythematous lesions that he was concern for abscesses after IV drug use.  There was multiple erythematous lesions  consistent with cellulitic regions with no evidence of underlying abscess.  No indication for incision and drainage.  No indication for labs or imaging.  Patient will be treated with IM clindamycin here, outpatient clindamycin..  Follow-up with primary care as needed.  Patient is given ED precautions to return to the ED for any worsening or new symptoms.     ____________________________________________  FINAL CLINICAL IMPRESSION(S) / ED DIAGNOSES  Final diagnoses:  Cellulitis of hand      NEW MEDICATIONS STARTED DURING THIS VISIT:  ED Discharge Orders         Ordered    clindamycin (CLEOCIN) 300 MG capsule  4 times daily        01/18/20 2141              This chart was dictated using voice recognition software/Dragon. Despite best efforts to proofread, errors can occur which can change the meaning. Any change was purely unintentional.    Mallery Harshman, 2142,  PA-C 01/18/20 2356    Phineas Semen, MD 01/19/20 1620

## 2020-06-11 ENCOUNTER — Encounter: Payer: Self-pay | Admitting: Emergency Medicine

## 2020-06-11 ENCOUNTER — Inpatient Hospital Stay
Admission: EM | Admit: 2020-06-11 | Discharge: 2020-06-14 | DRG: 603 | Disposition: A | Discharge status: Home / Self Care | Payer: Self-pay | Attending: Internal Medicine | Admitting: Internal Medicine

## 2020-06-11 DIAGNOSIS — H409 Unspecified glaucoma: Secondary | ICD-10-CM | POA: Diagnosis present

## 2020-06-11 DIAGNOSIS — L0201 Cutaneous abscess of face: Principal | ICD-10-CM

## 2020-06-11 DIAGNOSIS — L03211 Cellulitis of face: Secondary | ICD-10-CM | POA: Diagnosis present

## 2020-06-11 DIAGNOSIS — Z20822 Contact with and (suspected) exposure to covid-19: Secondary | ICD-10-CM | POA: Diagnosis present

## 2020-06-11 DIAGNOSIS — K029 Dental caries, unspecified: Secondary | ICD-10-CM | POA: Diagnosis present

## 2020-06-11 DIAGNOSIS — F17210 Nicotine dependence, cigarettes, uncomplicated: Secondary | ICD-10-CM | POA: Diagnosis present

## 2020-06-11 DIAGNOSIS — K047 Periapical abscess without sinus: Secondary | ICD-10-CM

## 2020-06-11 DIAGNOSIS — Z5900 Homelessness unspecified: Secondary | ICD-10-CM

## 2020-06-11 LAB — CBC WITH DIFFERENTIAL/PLATELET
Abs Immature Granulocytes: 0.03 10*3/uL (ref 0.00–0.07)
Basophils Absolute: 0 10*3/uL (ref 0.0–0.1)
Basophils Relative: 0 %
Eosinophils Absolute: 0.2 10*3/uL (ref 0.0–0.5)
Eosinophils Relative: 2 %
HCT: 44.3 % (ref 39.0–52.0)
Hemoglobin: 14.6 g/dL (ref 13.0–17.0)
Immature Granulocytes: 0 %
Lymphocytes Relative: 19 %
Lymphs Abs: 1.9 10*3/uL (ref 0.7–4.0)
MCH: 28.9 pg (ref 26.0–34.0)
MCHC: 33 g/dL (ref 30.0–36.0)
MCV: 87.7 fL (ref 80.0–100.0)
Monocytes Absolute: 0.7 10*3/uL (ref 0.1–1.0)
Monocytes Relative: 7 %
Neutro Abs: 7.3 10*3/uL (ref 1.7–7.7)
Neutrophils Relative %: 72 %
Platelets: 231 10*3/uL (ref 150–400)
RBC: 5.05 MIL/uL (ref 4.22–5.81)
RDW: 13.2 % (ref 11.5–15.5)
WBC: 10.1 10*3/uL (ref 4.0–10.5)
nRBC: 0 % (ref 0.0–0.2)

## 2020-06-11 LAB — COMPREHENSIVE METABOLIC PANEL
ALT: 47 U/L — ABNORMAL HIGH (ref 0–44)
AST: 59 U/L — ABNORMAL HIGH (ref 15–41)
Albumin: 4 g/dL (ref 3.5–5.0)
Alkaline Phosphatase: 102 U/L (ref 38–126)
Anion gap: 11 (ref 5–15)
BUN: 11 mg/dL (ref 6–20)
CO2: 25 mmol/L (ref 22–32)
Calcium: 9.2 mg/dL (ref 8.9–10.3)
Chloride: 100 mmol/L (ref 98–111)
Creatinine, Ser: 0.7 mg/dL (ref 0.61–1.24)
GFR, Estimated: >60 mL/min (ref >60)
Glucose, Bld: 121 mg/dL — ABNORMAL HIGH (ref 70–99)
Potassium: 4 mmol/L (ref 3.5–5.1)
Sodium: 136 mmol/L (ref 135–145)
Total Bilirubin: 0.8 mg/dL (ref 0.3–1.2)
Total Protein: 7.9 g/dL (ref 6.5–8.1)

## 2020-06-11 LAB — LACTIC ACID, PLASMA
Lactic Acid, Venous: 2 mmol/L (ref 0.5–1.9)
Lactic Acid, Venous: 1.3 mmol/L (ref 0.5–1.9)

## 2020-06-11 MED ORDER — CLINDAMYCIN PHOSPHATE 600 MG/50ML IV SOLN
600.0000 mg | Freq: Once | INTRAVENOUS | Status: AC
  Administered 2020-06-12: 600 mg via INTRAVENOUS
  Filled 2020-06-11: qty 50

## 2020-06-11 MED ORDER — SODIUM CHLORIDE 0.9 % IV BOLUS
1000.0000 mL | Freq: Once | INTRAVENOUS | Status: AC
  Administered 2020-06-12: 1000 mL via INTRAVENOUS

## 2020-06-11 NOTE — ED Triage Notes (Signed)
Pt reports "pinple" to the chin x1 week ago and he squeezed it, scab formed and then swelling increased 3 days ago. Today pt picked scab off and attempted to drain area by squeezing. Drainage and swelling noted as well as redness. Drainage is white and blood tinged. Swelling noted to go from pts chin to the lower lip.

## 2020-06-11 NOTE — ED Provider Notes (Signed)
[facility] Emergency Department Provider Note  ____________________________________________   Event Date/Time   First MD Initiated Contact with Patient 06/11/20 2218     (approximate)  I have reviewed the triage vital signs and the nursing notes.   HISTORY  Chief Complaint Abscess    HPI Gerald MULLENS Sr. is a 50 y.o. male with CKD, depression who comes in for facial abscess.  Patient states that he has a scab there that he broke open and then over the past 3 days he developed more swelling that is constant, severe, nothing makes better, nothing makes it worse.  Still able to open his mouth.  Patient states that he is homeless and he cannot afford antibiotics.  States that he had MRSA previously.  Did take his friend's clindamycin which made the infection go away on his hand previously.          Past Medical History:  Diagnosis Date  . Chronic kidney disease    H/O STONES  . Depression   . Glaucoma     There are no problems to display for this patient.   Past Surgical History:  Procedure Laterality Date  . ARM SURGERY    . KNEE SURGERY    . OPEN REDUCTION INTERNAL FIXATION (ORIF) DISTAL RADIAL FRACTURE Left 07/07/2015   Procedure: OPEN REDUCTION INTERNAL FIXATION (ORIF) DISTAL RADIAL FRACTURE;  Surgeon: Kennedy Bucker, MD;  Location: [facility] ORS;  Service: Orthopedics;  Laterality: Left;    Prior to Admission medications   Medication Sig Start Date End Date Taking? Authorizing Provider  clindamycin (CLEOCIN) 300 MG capsule Take 1 capsule (300 mg total) by mouth 4 (four) times daily. 01/18/20   Cuthriell, Delorise Royals, PA-C  cyclobenzaprine (FLEXERIL) 5 MG tablet Take 1 tablet (5 mg total) by mouth 3 (three) times daily as needed for muscle spasms. 04/01/16   Menshew, Charlesetta Ivory, PA-C  HYDROcodone-acetaminophen (NORCO) 5-325 MG per tablet Take 1-2 tablets by mouth every 4 (four) hours as needed for moderate pain. 10/28/14   Beers, Charmayne Sheer,  PA-C  HYDROcodone-acetaminophen (NORCO) 5-325 MG tablet Take 1 tablet by mouth every 6 (six) hours as needed for moderate pain. 07/07/15   Kennedy Bucker, MD  naproxen sodium (ANAPROX) 550 MG tablet Take 1 tablet (550 mg total) by mouth 2 (two) times daily with a meal. 04/01/16   Menshew, Charlesetta Ivory, PA-C    Allergies Patient has no known allergies.  History reviewed. No pertinent family history.  Social History Social History   Tobacco Use  . Smoking status: Current Every Day Smoker    Packs/day: 2.00    Years: 20.00    Pack years: 40.00    Types: Cigarettes  . Smokeless tobacco: Never Used  Substance Use Topics  . Alcohol use: Yes    Comment: occasionally  . Drug use: No      Review of Systems Constitutional: No fever/chills Eyes: No visual changes. ENT: No sore throat.  Swelling of the face Cardiovascular: Denies chest pain. Respiratory: Denies shortness of breath. Gastrointestinal: No abdominal pain.  No nausea, no vomiting.  No diarrhea.  No constipation. Genitourinary: Negative for dysuria. Musculoskeletal: Negative for back pain. Skin: Negative for rash. Neurological: Negative for headaches, focal weakness or numbness. All other ROS negative ____________________________________________   PHYSICAL EXAM:  VITAL SIGNS: ED Triage Vitals  Enc Vitals Group     BP 06/11/20 2045 (!) 137/91     Pulse Rate 06/11/20 2045 84  Resp 06/11/20 2045 17     Temp 06/11/20 2045 99 F (37.2 C)     Temp Source 06/11/20 2045 Oral     SpO2 06/11/20 2045 98 %     Weight 06/11/20 2046 190 lb (86.2 kg)     Height --      Head Circumference --      Peak Flow --      Pain Score --      Pain Loc --      Pain Edu? --      Excl. in GC? --     Constitutional: Alert and oriented. Well appearing and in no acute distress. Eyes: Conjunctivae are normal. EOMI. Head: Atraumatic. Nose: No congestion/rhinnorhea. Mouth/Throat: Mucous membranes are moist.  Very poor dentition.   Indurated swelling noted to the chin. Neck: No stridor. Trachea Midline. FROM Cardiovascular: Normal rate, regular rhythm. Grossly normal heart sounds.  Good peripheral circulation. Respiratory: Normal respiratory effort.  No retractions. Lungs CTAB. Gastrointestinal: Soft and nontender. No distention. No abdominal bruits.  Musculoskeletal: No lower extremity tenderness nor edema.  No joint effusions. Neurologic:  Normal speech and language. No gross focal neurologic deficits are appreciated.  Skin:  Skin is warm, dry and intact. No rash noted. Psychiatric: Mood and affect are normal. Speech and behavior are normal. GU: Deferred   ____________________________________________   LABS (all labs ordered are listed, but only abnormal results are displayed)  Labs Reviewed  LACTIC ACID, PLASMA - Abnormal; Notable for the following components:      Result Value   Lactic Acid, Venous 2.0 (*)    All other components within normal limits  COMPREHENSIVE METABOLIC PANEL - Abnormal; Notable for the following components:   Glucose, Bld 121 (*)    AST 59 (*)    ALT 47 (*)    All other components within normal limits  CULTURE, BLOOD (ROUTINE X 2)  CULTURE, BLOOD (ROUTINE X 2)  LACTIC ACID, PLASMA  CBC WITH DIFFERENTIAL/PLATELET   ____________________________________________  RADIOLOGY   ED MD interpretation: Pending  Official radiology report(s): No results found.  ____________________________________________   PROCEDURES  Procedure(s) performed (including Critical Care):  Procedures   ____________________________________________   INITIAL IMPRESSION / ASSESSMENT AND PLAN / ED COURSE  Gerald RAUF Sr. was evaluated in Emergency Department on 06/11/2020 for the symptoms described in the history of present illness. He was evaluated in the context of the global COVID-19 pandemic, which necessitated consideration that the patient might be at risk for infection with the  SARS-CoV-2 virus that causes COVID-19. Institutional protocols and algorithms that pertain to the evaluation of patients at risk for COVID-19 are in a state of rapid change based on information released by regulatory bodies including the CDC and federal and state organizations. These policies and algorithms were followed during the patient's care in the ED.    Patient is a 50 year old who comes in with abscess on his chin.  Feels very indurated and not sure there is anything that can be drained present very poor dentition therefore will get CT scan to evaluate for the extension of the abscess.  Otherwise it could just be cellulitis.  Labs ordered evaluate for bacteremia/sepsis but at this time he does not meet sepsis or SIRS criteria.  This will be a difficult situation given patient states that he cannot afford the antibiotics.  Patient will be handed off to oncoming team pending CT imaging.  Depending upon how diffuse this is patient may need to be admitted  for IV antibiotics versus if patient feels like he can afford the clindamycin and then potentially can go home with oral antibiotics       ____________________________________________   FINAL CLINICAL IMPRESSION(S) / ED DIAGNOSES   Final diagnoses:  None      MEDICATIONS GIVEN DURING THIS VISIT:  Medications  sodium chloride 0.9 % bolus 1,000 mL (has no administration in time range)  clindamycin (CLEOCIN) IVPB 600 mg (has no administration in time range)     ED Discharge Orders    None       Note:  This document was prepared using Dragon voice recognition software and may include unintentional dictation errors.   Concha Se, MD 06/12/20 0005

## 2020-06-11 NOTE — ED Provider Notes (Signed)
-----------------------------------------   11:27 PM on 06/11/2020 -----------------------------------------  Assuming care from Dr. Fuller Plan.  In short, Gerald [name]. is a 50 y.o. male with a chief complaint of abscess to face (chin).  Refer to the original H&P for additional details.  The current plan of care is to follow up lactic acid and CT scan.  Received IV abx.   ----------------------------------------- 1:51 AM on 06/12/2020 -----------------------------------------  Spoke with phone with Dr. Andee Poles with ENT who recommends admission for antibiotics (he agrees with the clindamycin 600 mg IV received thus far).  He will see the patient in the morning.  I discussed the case with the hospitalist, Dr. Para March, who will admit.  I have dated the patient as well.  His repeat lactic acid is 1.3 which is reassuring.       Loleta Rose, MD 06/12/20 [id number]

## 2020-06-12 ENCOUNTER — Emergency Department: Payer: Self-pay

## 2020-06-12 ENCOUNTER — Other Ambulatory Visit: Payer: Self-pay

## 2020-06-12 DIAGNOSIS — L03211 Cellulitis of face: Secondary | ICD-10-CM | POA: Diagnosis present

## 2020-06-12 DIAGNOSIS — K047 Periapical abscess without sinus: Secondary | ICD-10-CM

## 2020-06-12 LAB — RESP PANEL BY RT-PCR (FLU A&B, COVID) ARPGX2
Influenza A by PCR: NEGATIVE
Influenza B by PCR: NEGATIVE
SARS Coronavirus 2 by RT PCR: NEGATIVE

## 2020-06-12 LAB — HIV ANTIBODY (ROUTINE TESTING W REFLEX): HIV Screen 4th Generation wRfx: NONREACTIVE

## 2020-06-12 MED ORDER — ONDANSETRON HCL 4 MG PO TABS: 4.0000 mg | ORAL_TABLET | Freq: Four times a day (QID) | ORAL | Status: DC | PRN

## 2020-06-12 MED ORDER — ACETAMINOPHEN 325 MG PO TABS: 650.0000 mg | ORAL_TABLET | Freq: Four times a day (QID) | ORAL | Status: DC | PRN

## 2020-06-12 MED ORDER — GENTAMICIN SULFATE 0.1 % EX OINT
TOPICAL_OINTMENT | Freq: Four times a day (QID) | CUTANEOUS | Status: DC
  Filled 2020-06-12: qty 15

## 2020-06-12 MED ORDER — HYDROCODONE-ACETAMINOPHEN 5-325 MG PO TABS
1.0000 | ORAL_TABLET | ORAL | Status: DC | PRN
  Administered 2020-06-12 (×2): 1 via ORAL
  Administered 2020-06-13 – 2020-06-14 (×4): 2 via ORAL
  Filled 2020-06-12: qty 2
  Filled 2020-06-12: qty 1
  Filled 2020-06-12: qty 2
  Filled 2020-06-12: qty 1
  Filled 2020-06-12 (×2): qty 2

## 2020-06-12 MED ORDER — IOHEXOL 300 MG/ML  SOLN
75.0000 mL | Freq: Once | INTRAMUSCULAR | Status: AC | PRN
  Administered 2020-06-12: 75 mL via INTRAVENOUS

## 2020-06-12 MED ORDER — ONDANSETRON HCL 4 MG/2ML IJ SOLN: 4.0000 mg | Freq: Four times a day (QID) | INTRAMUSCULAR | Status: DC | PRN

## 2020-06-12 MED ORDER — ACETAMINOPHEN 650 MG RE SUPP: 650.0000 mg | Freq: Four times a day (QID) | RECTAL | Status: DC | PRN

## 2020-06-12 MED ORDER — KETOROLAC TROMETHAMINE 30 MG/ML IJ SOLN
30.0000 mg | Freq: Four times a day (QID) | INTRAMUSCULAR | Status: DC | PRN
  Administered 2020-06-12: 30 mg via INTRAVENOUS
  Filled 2020-06-12: qty 1

## 2020-06-12 MED ORDER — NICOTINE 21 MG/24HR TD PT24
21.0000 mg | MEDICATED_PATCH | Freq: Every day | TRANSDERMAL | Status: DC
  Administered 2020-06-12 – 2020-06-14 (×3): 21 mg via TRANSDERMAL
  Filled 2020-06-12 (×4): qty 1

## 2020-06-12 MED ORDER — CLINDAMYCIN PHOSPHATE 600 MG/50ML IV SOLN
600.0000 mg | Freq: Three times a day (TID) | INTRAVENOUS | Status: DC
  Administered 2020-06-12 – 2020-06-14 (×7): 600 mg via INTRAVENOUS
  Filled 2020-06-12 (×12): qty 50

## 2020-06-12 NOTE — Progress Notes (Signed)
Same day rounding progress note  Patient seen and examined.  Please see Dr. Lianne Bushy dictated history and physical for further details.  I agree with the assessment and plan.  Waiting on ENT.  Patient would like to wait for I&D per ENT discussion till tomorrow.  Time spent: 15 minutes

## 2020-06-12 NOTE — H&P (Signed)
History and Physical    Gerald [name] PPI:[id number] DOB: 04/07/1970 DOA: 06/11/2020  PCP: Patient, No Pcp Per (Inactive)   Patient coming from: Home  I have personally briefly reviewed patient's old medical records in [facility]  Chief Complaint: Painful lesion oozing pus on chin  HPI: Gerald CHANCE Sr. is a 50 y.o. male with no significant past medical history who presents to the emergency room with painful swelling on his chin that started off as a pimple but which has now increased in size and is now tender and draining pus and blood.  He denies fever or chills. ED course: BP 137/91, pulse 84, respiration 17 O2 sat 98% on room air.  CBC completely normal, CMP significant for mildly elevated transaminases.  Lactic acid of 2/1.3. Imaging: CT maxillofacial: Diffuse poor dentition with multiple caries of the left mandibular incisors and canine, with adjacent subperiosteal abscess measuring 14 x 7 mm. 2. Skin lesion at the midline chin, possibly in communication with the subperiosteal collection adjacent to the mandible.  The ED provider spoke with ENT specialist Dr. Andee Poles who advised on IV clindamycin and admission to hospitalist service with ENT consulting  Review of Systems: As per HPI otherwise all other systems on review of systems negative.    Past Medical History:  Diagnosis Date  . Chronic kidney disease    H/O STONES  . Depression   . Glaucoma     Past Surgical History:  Procedure Laterality Date  . ARM SURGERY    . KNEE SURGERY    . OPEN REDUCTION INTERNAL FIXATION (ORIF) DISTAL RADIAL FRACTURE Left 07/07/2015   Procedure: OPEN REDUCTION INTERNAL FIXATION (ORIF) DISTAL RADIAL FRACTURE;  Surgeon: Kennedy Bucker, MD;  Location: [facility] ORS;  Service: Orthopedics;  Laterality: Left;     reports that he has been smoking cigarettes. He has a 40.00 pack-year smoking history. He has never used smokeless tobacco. He reports current alcohol use. He reports that  he does not use drugs.  No Known Allergies  History reviewed. No pertinent family history.    Prior to Admission medications   Medication Sig Start Date End Date Taking? Authorizing Provider  clindamycin (CLEOCIN) 300 MG capsule Take 1 capsule (300 mg total) by mouth 4 (four) times daily. 01/18/20   Cuthriell, Delorise Royals, PA-C  cyclobenzaprine (FLEXERIL) 5 MG tablet Take 1 tablet (5 mg total) by mouth 3 (three) times daily as needed for muscle spasms. 04/01/16   Menshew, Charlesetta Ivory, PA-C  HYDROcodone-acetaminophen (NORCO) 5-325 MG per tablet Take 1-2 tablets by mouth every 4 (four) hours as needed for moderate pain. 10/28/14   Beers, Charmayne Sheer, PA-C  HYDROcodone-acetaminophen (NORCO) 5-325 MG tablet Take 1 tablet by mouth every 6 (six) hours as needed for moderate pain. 07/07/15   Kennedy Bucker, MD  naproxen sodium (ANAPROX) 550 MG tablet Take 1 tablet (550 mg total) by mouth 2 (two) times daily with a meal. 04/01/16   Menshew, Charlesetta Ivory, PA-C    Physical Exam: Vitals:   06/11/20 2045 06/11/20 2046  BP: (!) 137/91   Pulse: 84   Resp: 17   Temp: 99 F (37.2 C)   TempSrc: Oral   SpO2: 98%   Weight:  86.2 kg     Vitals:   06/11/20 2045 06/11/20 2046  BP: (!) 137/91   Pulse: 84   Resp: 17   Temp: 99 F (37.2 C)   TempSrc: Oral   SpO2: 98%   Weight:  86.2 kg      Constitutional: Alert and oriented x 3 . Not in any apparent distress HEENT:      Head: Normocephalic and atraumatic.  Granuloma type lesion with moist oozing and surrounding erythema on mid chin with swelling of left lower lip        Eyes: PERLA, EOMI, Conjunctivae are normal. Sclera is non-icteric.  Twitching/intermittent spasm left eyelid      Mouth/Throat: Mucous membranes are moist.       Neck: Supple with no signs of meningismus. Cardiovascular: Regular rate and rhythm. No murmurs, gallops, or rubs. 2+ symmetrical distal pulses are present . No JVD. No LE edema Respiratory: Respiratory effort normal  .Lungs sounds clear bilaterally. No wheezes, crackles, or rhonchi.  Gastrointestinal: Soft, non tender, and non distended with positive bowel sounds.  Genitourinary: No CVA tenderness. Musculoskeletal: Nontender with normal range of motion in all extremities. No cyanosis, or erythema of extremities. Neurologic:  Face is symmetric. Moving all extremities. No gross focal neurologic deficits . Skin: Skin is warm, dry.  No rash or ulcers Psychiatric: Mood and affect are normal    Labs on Admission: I have personally reviewed following labs and imaging studies  CBC: Recent Labs  Lab 06/11/20 2048  WBC 10.1  NEUTROABS 7.3  HGB 14.6  HCT 44.3  MCV 87.7  PLT 231   Basic Metabolic Panel: Recent Labs  Lab 06/11/20 2048  NA 136  K 4.0  CL 100  CO2 25  GLUCOSE 121*  BUN 11  CREATININE 0.70  CALCIUM 9.2   GFR: Estimated Creatinine Clearance: 121.5 mL/min (by C-G formula based on SCr of 0.7 mg/dL). Liver Function Tests: Recent Labs  Lab 06/11/20 2048  AST 59*  ALT 47*  ALKPHOS 102  BILITOT 0.8  PROT 7.9  ALBUMIN 4.0   No results for input(s): LIPASE, AMYLASE in the last 168 hours. No results for input(s): AMMONIA in the last 168 hours. Coagulation Profile: No results for input(s): INR, PROTIME in the last 168 hours. Cardiac Enzymes: No results for input(s): CKTOTAL, CKMB, CKMBINDEX, TROPONINI in the last 168 hours. BNP (last 3 results) No results for input(s): PROBNP in the last 8760 hours. HbA1C: No results for input(s): HGBA1C in the last 72 hours. CBG: No results for input(s): GLUCAP in the last 168 hours. Lipid Profile: No results for input(s): CHOL, HDL, LDLCALC, TRIG, CHOLHDL, LDLDIRECT in the last 72 hours. Thyroid Function Tests: No results for input(s): TSH, T4TOTAL, FREET4, T3FREE, THYROIDAB in the last 72 hours. Anemia Panel: No results for input(s): VITAMINB12, FOLATE, FERRITIN, TIBC, IRON, RETICCTPCT in the last 72 hours. Urine analysis: No results  found for: COLORURINE, APPEARANCEUR, LABSPEC, PHURINE, GLUCOSEU, HGBUR, BILIRUBINUR, KETONESUR, PROTEINUR, UROBILINOGEN, NITRITE, LEUKOCYTESUR  Radiological Exams on Admission: CT Maxillofacial W Contrast  Result Date: 06/12/2020 CLINICAL DATA:  Facial swelling EXAM: CT MAXILLOFACIAL WITH CONTRAST TECHNIQUE: Multidetector CT imaging of the maxillofacial structures was performed with intravenous contrast. Multiplanar CT image reconstructions were also generated. CONTRAST:  46mL OMNIPAQUE IOHEXOL 300 MG/ML  SOLN COMPARISON:  None. FINDINGS: Osseous: No facial fracture. Dental: Diffuse poor dentition with multiple caries of the left mandibular incisors and canine. There is an adjacent subperiosteal abscess that measures 14 x 7 mm. There is edema and inflammatory change of the overlying subcutaneous tissues. Orbits: Negative. No traumatic or inflammatory finding. Sinuses: No fluid levels or advanced mucosal thickening. Soft tissues: In addition to the above, there is a skin lesion at the midline chin. This may be in  communication with the subperiosteal collection adjacent to the mandible. Limited intracranial: Normal. IMPRESSION: 1. Diffuse poor dentition with multiple caries of the left mandibular incisors and canine, with adjacent subperiosteal abscess measuring 14 x 7 mm. 2. Skin lesion at the midline chin, possibly in communication with the subperiosteal collection adjacent to the mandible. Electronically Signed   By: Deatra Robinson M.D.   On: 06/12/2020 00:53     Assessment/Plan 50 year old male with no significant past medical history presenting with purulent draining lesion on chin  Facial cellulitis Subperiosteal dental abscess -CT maxillofacial: Diffuse poor dentition with multiple caries of the left mandibular incisors and canine, with adjacent subperiosteal abscess measuring 14 x 7 mm. Skin lesion at the midline chin, possibly in communication with the subperiosteal collection adjacent to the  mandible. - Abscess draining to skin - Clindamycin 600 mg 3 times daily - ENT consult - Pain management and supportive care   DVT prophylaxis: Low risk no prophylaxis Code Status: full code  Family Communication:  none  Disposition Plan: Back to previous home environment Consults called: ENT Status:At the time of admission, it appears that the appropriate admission status for this patient is INPATIENT. This is judged to be reasonable and necessary in order to provide the required intensity of service to ensure the patient's safety given the presenting symptoms, physical exam findings, and initial radiographic and laboratory data in the context of their  Comorbid conditions.   Patient requires inpatient status due to high intensity of service, high risk for further deterioration and high frequency of surveillance required.   I certify that at the point of admission it is my clinical judgment that the patient will require inpatient hospital care spanning beyond 2 midnights     Andris Baumann MD [facility]     06/12/2020, 1:58 AM

## 2020-06-12 NOTE — Consult Note (Signed)
..   Cheng, Dec [id number] 1970-09-10 Delfino Lovett, MD  Reason for Consult: cellulitis, dental caries, subperiosteal abscess of mandible  HPI: 50 y.o. male with 4 day history of a sore on his chin.  Initially was scabbed over and this opened up and began to drain with swelling.  Patient reports pain in chin that is constant.  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  Very poor dentition.  Previously was prescribed antbiotics but could no afford.  Reports no significant change from this morning.  Allergies: No Known Allergies  ROS: Review of systems normal other than 12 systems except per HPI.  PMH:  Past Medical History:  Diagnosis Date  . Chronic kidney disease    H/O STONES  . Depression   . Glaucoma     FH: History reviewed. No pertinent family history.  SH:  Social History   Socioeconomic History  . Marital status: Single    Spouse name: Not on file  . Number of children: Not on file  . Years of education: Not on file  . Highest education level: Not on file  Occupational History  . Not on file  Tobacco Use  . Smoking status: Current Every Day Smoker    Packs/day: 2.00    Years: 20.00    Pack years: 40.00    Types: Cigarettes  . Smokeless tobacco: Never Used  Substance and Sexual Activity  . Alcohol use: Yes    Comment: occasionally  . Drug use: No  . Sexual activity: Not on file  Other Topics Concern  . Not on file  Social History Narrative  . Not on file   Social Determinants of Health   Financial Resource Strain: Not on file  Food Insecurity: Not on file  Transportation Needs: Not on file  Physical Activity: Not on file  Stress: Not on file  Social Connections: Not on file  Intimate Partner Violence: Not on file    PSH:  Past Surgical History:  Procedure Laterality Date  . ARM SURGERY    . KNEE SURGERY    . OPEN REDUCTION INTERNAL FIXATION (ORIF) DISTAL RADIAL FRACTURE Left 07/07/2015   Procedure: OPEN REDUCTION INTERNAL FIXATION (ORIF) DISTAL  RADIAL FRACTURE;  Surgeon: Kennedy Bucker, MD;  Location: [facility] ORS;  Service: Orthopedics;  Laterality: Left;    Physical  Exam:  GEN- NAD, supine in bed NEURO-  CN 2-12 grossly intact and symmetric, poor vision EARS- external ears clear NOSE- clear anteriorly OC/OP- poor dentition with mutiple caries and fractured teeth, swelling adjacent to left midline incisors that is tender and firm.  No obvious fluctuance NECK- no masses or lesions RESP- unlabored CARD-  RRR FACE-  Induration of midline and left of chin with erythema and purulence.  Crusting removed.  Cellulitic changes.  No obvious frank fluctuance more just diffuse inflammation.  Culture taken of purulence  CT maxillofacial with contrast- poor dentition with multiple caries, small abscess adjacent to incisors just left of midline of mandible with resultant inflammation and cellulitis of chin   A/P: Discussed findings with patient and findings.  I offered drainage today at beside intra-orally.  Culture taken.  Patient wishes to wait until tomorrow to consider I&D.  Will recheck in a.m. and if persistent swelling, consider intra-oral drainage at that time.  Will follow culture results.  Continue MRSA coverage.  Willl add topical gentamicin ointment.   Roney Mans Duston Smolenski 06/12/2020 12:23 PM

## 2020-06-12 NOTE — TOC Initial Note (Addendum)
Transition of Care (TOC) - Initial/Assessment Note    Patient Details  Name: Gerald HERZBERG Sr. MRN: [id number] Date of Birth: 04-Jan-1971  Transition of Care [facility]) CM/SW Contact:    Liliana Cline, LCSW Phone Number: 06/12/2020, 11:59 AM  Clinical Narrative:                CSW spoke with patient regarding resources. Patient states he has been living in a tent. He is interested in homeless shelter information. Informed patient about [employer] and that he has to call to apply before going, he verbalized understanding. Patient is uninsured and reported he has no PCP. He was interested in referral being made to [facility] and [facility]. CSW made referrals and will provide application to patient. Also provided [facility] List for patient to use if needed. Patient reported he has a friend who provides transportation when they are able to. Patient denied additional needs at this time.   Expected Discharge Plan: Homeless Shelter Barriers to Discharge: Continued Medical Work up   Patient Goals and CMS Choice Patient states their goals for this hospitalization and ongoing recovery are:: to get better CMS Medicare.gov Compare Post Acute Care list provided to:: Patient Choice offered to / list presented to : Patient  Expected Discharge Plan and Services Expected Discharge Plan: Homeless Shelter       Living arrangements for the past 2 months: Homeless                                      Prior Living Arrangements/Services Living arrangements for the past 2 months: Homeless   Patient language and need for interpreter reviewed:: Yes        Need for Family Participation in Patient Care: Yes (Comment) Care giver support system in place?: Yes (comment)   Criminal Activity/Legal Involvement Pertinent to Current Situation/Hospitalization: No - Comment as needed  Activities of Daily Living Home Assistive Devices/Equipment: Cane (specify quad  or straight) ADL Screening (condition at time of admission) Patient's cognitive ability adequate to safely complete daily activities?: Yes Is the patient deaf or have difficulty hearing?: No Does the patient have difficulty seeing, even when wearing glasses/contacts?: Yes Does the patient have difficulty concentrating, remembering, or making decisions?: No Patient able to express need for assistance with ADLs?: Yes Does the patient have difficulty dressing or bathing?: No Independently performs ADLs?: Yes (appropriate for developmental age) Does the patient have difficulty walking or climbing stairs?: Yes Weakness of Legs: None Weakness of Arms/Hands: None  Permission Sought/Granted Permission sought to share information with : Oceanographer granted to share information with : Yes, Verbal Permission Granted     Permission granted to share info w AGENCY: [facility], [facility], [facility]        Emotional Assessment       Orientation: : Oriented to Self,Oriented to Place,Oriented to  Time,Oriented to Situation Alcohol / Substance Use: Not Applicable Psych Involvement: No (comment)  Admission diagnosis:  Facial abscess [L02.01] Facial cellulitis [L03.211] Patient Active Problem List   Diagnosis Date Noted  . Facial cellulitis 06/12/2020  . Dental abscess 06/12/2020   PCP:  Patient, No Pcp Per (Inactive) Pharmacy:  No Pharmacies Listed    Social Determinants of Health (SDOH) Interventions    Readmission Risk Interventions No flowsheet data found.

## 2020-06-12 NOTE — ED Notes (Signed)
Request made for transport to the floor ?

## 2020-06-13 LAB — BASIC METABOLIC PANEL
Anion gap: 9 (ref 5–15)
BUN: 14 mg/dL (ref 6–20)
CO2: 25 mmol/L (ref 22–32)
Calcium: 9.2 mg/dL (ref 8.9–10.3)
Chloride: 103 mmol/L (ref 98–111)
Creatinine, Ser: 0.72 mg/dL (ref 0.61–1.24)
GFR, Estimated: >60 mL/min (ref >60)
Glucose, Bld: 91 mg/dL (ref 70–99)
Potassium: 4 mmol/L (ref 3.5–5.1)
Sodium: 137 mmol/L (ref 135–145)

## 2020-06-13 LAB — CBC
HCT: 43.1 % (ref 39.0–52.0)
Hemoglobin: 14.6 g/dL (ref 13.0–17.0)
MCH: 29.2 pg (ref 26.0–34.0)
MCHC: 33.9 g/dL (ref 30.0–36.0)
MCV: 86.2 fL (ref 80.0–100.0)
Platelets: 210 10*3/uL (ref 150–400)
RBC: 5 MIL/uL (ref 4.22–5.81)
RDW: 13.1 % (ref 11.5–15.5)
WBC: 6.8 10*3/uL (ref 4.0–10.5)
nRBC: 0 % (ref 0.0–0.2)

## 2020-06-13 NOTE — Progress Notes (Signed)
[facility]   PATIENT NAME: Gerald [name]    MR#:  [id number]  DATE OF BIRTH:  1970-06-01  SUBJECTIVE:  CHIEF COMPLAINT:   Chief Complaint  Patient presents with  . Abscess  s/p I & D on 4/25. Denies any new c/o REVIEW OF SYSTEMS:  Review of Systems  Constitutional: Negative for diaphoresis, fever, malaise/fatigue and weight loss.  HENT: Negative for ear discharge, ear pain, hearing loss, nosebleeds, sore throat and tinnitus.        Dental caries and odontoid abscess  Eyes: Negative for blurred vision and pain.  Respiratory: Negative for cough, hemoptysis, shortness of breath and wheezing.   Cardiovascular: Negative for chest pain, palpitations, orthopnea and leg swelling.  Gastrointestinal: Negative for abdominal pain, blood in stool, constipation, diarrhea, heartburn, nausea and vomiting.  Genitourinary: Negative for dysuria, frequency and urgency.  Musculoskeletal: Negative for back pain and myalgias.  Skin: Negative for itching and rash.  Neurological: Negative for dizziness, tingling, tremors, focal weakness, seizures, weakness and headaches.  Psychiatric/Behavioral: Negative for depression. The patient is not nervous/anxious.    DRUG ALLERGIES:  No Known Allergies VITALS:  Blood pressure 119/70, pulse (!) 58, temperature 98.3 F (36.8 C), temperature source Oral, resp. rate 20, height 5\' 9"  (1.753 m), weight 90.7 kg, SpO2 97 %. PHYSICAL EXAMINATION:  Physical Exam  49 y m lying in bed comfortably. Looking older than stated age. Disheveled looking Oral - multiple dental caries and odontoid abscess now draining Lungs - clear to auscultation b/l, no wheezing, rales Cardio: s1 s2 normal, mo m/r/g Neuro: alert, oriented, nonfocal Abd: soft, non distended, non tender Skin: see picture      LABORATORY PANEL:  Male CBC Recent Labs  Lab 06/13/20 0452  WBC 6.8  HGB 14.6  HCT 43.1  PLT 210    ------------------------------------------------------------------------------------------------------------------ Chemistries  Recent Labs  Lab 06/11/20 2048 06/13/20 0452  NA 136 137  K 4.0 4.0  CL 100 103  CO2 25 25  GLUCOSE 121* 91  BUN 11 14  CREATININE 0.70 0.72  CALCIUM 9.2 9.2  AST 59*  --   ALT 47*  --   ALKPHOS 102  --   BILITOT 0.8  --    RADIOLOGY:  No results found. ASSESSMENT AND PLAN:  50 year old male with no significant past medical history presenting with purulent draining lesion on chin  Odontogenic abscess s/p I & D Multiple dental caries/cavities - await culture results. - continue IV Abx for now. TOC will assist with meds as he doesn't have insurance.  Body mass index is 29.53 kg/m.  Net IO Since Admission: -1,340.64 mL [06/13/20 2121]      Status is: Inpatient  Remains inpatient appropriate because:IV treatments appropriate due to intensity of illness or inability to take PO   Dispo: The patient is from: Home              Anticipated d/c is to: Home              Patient currently is not medically stable to d/c.   Difficult to place patient No    DVT prophylaxis:        SCD    Family Communication: "discussed with patient")   All the records are reviewed and case discussed with Care Management/Social Worker. Management plans discussed with the patient, nursing and they are in agreement.  CODE STATUS: Full Code Level of care: Med-Surg  TOTAL TIME TAKING CARE OF THIS PATIENT: 35 minutes.  More than 50% of the time was spent in counseling/coordination of care: YES  POSSIBLE D/C IN 1 DAYS, DEPENDING ON CLINICAL CONDITION.   Delfino Lovett M.D on 06/13/2020 at 9:21 PM  [facility]   CC: Primary care physician; Patient, No Pcp Per (Inactive)  Note: This dictation was prepared with Dragon dictation along with smaller phrase technology. Any transcriptional errors that result from this process are unintentional.

## 2020-06-13 NOTE — Progress Notes (Signed)
.. 06/13/2020 8:16 AM  Gerald [name] [id number]    Temp:  [97.7 F (36.5 C)-98.2 F (36.8 C)] 97.7 F (36.5 C) (04/25 0732) Pulse Rate:  [50-74] 54 (04/25 0732) Resp:  [17-18] 18 (04/25 0732) BP: (115-133)/(73-78) 129/78 (04/25 0732) SpO2:  [97 %-99 %] 99 % (04/25 0732),     Intake/Output Summary (Last 24 hours) at 06/13/2020 0816 Last data filed at 06/13/2020 1017 Gross per 24 hour  Intake 720 ml  Output 2850 ml  Net -2130 ml    Results for orders placed or performed during the hospital encounter of 06/11/20 (from the past 24 hour(s))  Aerobic/Anaerobic Culture w Gram Stain (surgical/deep wound)     Status: None (Preliminary result)   Collection Time: 06/12/20  3:09 PM   Specimen: Wound  Result Value Ref Range   Specimen Description      WOUND CHIN WOUND Performed at [facility], [address] [place], [place] [address]    Special Requests      NONE Performed at [facility], [address], [place], [place] [address]    Gram Stain      FEW WBC PRESENT,BOTH PMN AND MONONUCLEAR RARE GRAM POSITIVE COCCI IN CLUSTERS Performed at [facility], [address] [place], [place] [address]    Culture PENDING    Report Status PENDING   CBC     Status: None   Collection Time: 06/13/20  4:52 AM  Result Value Ref Range   WBC 6.8 4.0 - 10.5 K/uL   RBC 5.00 4.22 - 5.81 MIL/uL   Hemoglobin 14.6 13.0 - 17.0 g/dL   HCT 53.6 14.4 - 31.5 %   MCV 86.2 80.0 - 100.0 fL   MCH 29.2 26.0 - 34.0 pg   MCHC 33.9 30.0 - 36.0 g/dL   RDW 40.0 86.7 - 61.9 %   Platelets 210 150 - 400 K/uL   nRBC 0.0 0.0 - 0.2 %  Basic metabolic panel     Status: None   Collection Time: 06/13/20  4:52 AM  Result Value Ref Range   Sodium 137 135 - 145 mmol/L   Potassium 4.0 3.5 - 5.1 mmol/L   Chloride 103 98 - 111 mmol/L   CO2 25 22 - 32 mmol/L   Glucose, Bld 91 70 - 99 mg/dL   BUN 14 6 - 20 mg/dL   Creatinine, Ser 5.09 0.61 - 1.24 mg/dL   Calcium 9.2 8.9 - 32.6 mg/dL    GFR, Estimated >71 >24 mL/min   Anion gap 9 5 - 15    SUBJECTIVE:  No acute events.  Patient reports pain improved.  Reports decreased vision due to untreated glaucoma.  Reports some drainage intra-orally with decreased swelling  OBJECTIVE:  GEN-  NAD, supine in bed, poor vision OC/OP- continued induration of chin and lower lip, improved intraoral swelling that now can be seen in continuity to anterior mandible.  Mild pus around anterior incisors.  Procedure:  Incision and Drainage of facial abscess:  Pre-procedure diagnosis:  Facial abscess, Post-procedure diagnosis : same.  Description of procedure:  After verbal consent was obtained, the patient's anterior chin was anesthetized with 1.65ml of 1% lidocaine with 1:100,000 epinephrine.  An 11 blade scalpel was used to make a stab incision into the central aspect of the draining area of induration.  Purulent drainage and fibrinous tissue was removed with pressure and sterile forceps.  Patient tolerated the procedure well.  Dispo:  Floor status.  IMPRESSION:  Odontogenic  abscess s/p I&D, Dental caries, untreated glaucoma  PLAN:  Follow culture results and adjust accordingly.  Patient reports he has no access to medication and agree with social work assistance to help with getting dental appointment/transportation/ophthamlological evaluation.  Patient reports he is trying to also get disability but is unsure how to proceed.  Please re-consult ENT with any questions or concerns.  Gerald [name] 06/13/2020, 8:16 AM

## 2020-06-13 NOTE — TOC Progression Note (Addendum)
Transition of Care (TOC) - Progression Note    Patient Details  Name: Gerald FAGIN Sr. MRN: [id number] Date of Birth: [birth year]  Transition of Care [facility]) CM/SW Contact  Beverly Sessions, RN Phone Number: 06/13/2020, 11:57 AM  Clinical Narrative:    Met with patient and son at bedside.   Patient states that him and his 3 sons live in a truck.  At the time of discharge they intend to return to the truck  Reviewed with patient and son  - Contractor  - [facility]  Application  - low cost dental services  - low cost eye care services   Patient is unable to see due to glaucoma, so in agreement to make calls and complete applications on patient's behalf  Barrier:  Patient does not have and ID.  Most/All of these resources require photo ID.  Patient states he has his social security card.  Son to take patient too DMV to see about getting and ID.  They do not have mailing address in order for [employer] to mail the new ID card.  Son states they will be able to use the patient's mother's address for mailing.  They understand that until he has an ID their resources are very limited     Expected Discharge Plan: Homeless Shelter Barriers to Discharge: Continued Medical Work up  Expected Discharge Plan and Services Expected Discharge Plan: [place] arrangements for the past 2 months: Homeless                                       Social Determinants of Health ([place]) Interventions    Readmission Risk Interventions No flowsheet data found.

## 2020-06-14 ENCOUNTER — Other Ambulatory Visit: Payer: Self-pay

## 2020-06-14 DIAGNOSIS — L0201 Cutaneous abscess of face: Principal | ICD-10-CM

## 2020-06-14 LAB — CULTURE, BLOOD (ROUTINE X 2): Culture: NO GROWTH

## 2020-06-14 MED ORDER — CLINDAMYCIN HCL 150 MG PO CAPS
300.0000 mg | ORAL_CAPSULE | Freq: Four times a day (QID) | ORAL | 0 refills | Status: AC
  Filled 2020-06-14: qty 80, 10d supply, fill #0

## 2020-06-14 NOTE — Discharge Instructions (Signed)
Dental Abscess  A dental abscess is an area of pus in or around a tooth. It comes from an infection. It can cause pain and other symptoms. Treatment will help with symptoms and prevent the infection from spreading. Follow these instructions at home: Medicines  Take over-the-counter and prescription medicines only as told by your dentist.  If you were prescribed an antibiotic medicine, take it as told by your dentist. Do not stop taking it even if you start to feel better.  If you were prescribed a gel that has numbing medicine in it, use it exactly as told.  Do not drive or use heavy machinery (like a lawn mower) while taking prescription pain medicine. General instructions  Rinse out your mouth often with salt water. ? To make salt water, dissolve -1 tsp of salt in 1 cup of warm water.  Eat a soft diet while your mouth is healing.  Drink enough fluid to keep your urine pale yellow.  Do not apply heat to the outside of your mouth.  Do not use any products that contain nicotine or tobacco. These include cigarettes and e-cigarettes. If you need help quitting, ask your doctor.  Keep all follow-up visits as told by your dentist. This is important.   Prevent an abscess  Brush your teeth every morning and every night. Use fluoride toothpaste.  Floss your teeth each day.  Get dental cleanings as often as told by your dentist.  Think about getting dental sealant put on teeth that have deep holes (decay).  Drink water that has fluoride in it. ? Most tap water has fluoride. ? Check the label on bottled water to see if it has fluoride in it.  Drink water instead of sugary drinks.  Eat healthy meals and snacks.  Wear a mouth guard or face shield when you play sports. Contact a doctor if:  Your pain is worse, and medicine does not help. Get help right away if:  You have a fever or chills.  Your symptoms suddenly get worse.  You have a very bad headache.  You have problems  breathing or swallowing.  You have trouble opening your mouth.  You have swelling in your neck or close to your eye. Summary  A dental abscess is an area of pus in or around a tooth. It is caused by an infection.  Treatment will help with symptoms and prevent the infection from spreading.  Take over-the-counter and prescription medicines only as told by your dentist.  To prevent an abscess, take good care of your teeth. Brush your teeth every morning and night. Use floss every day.  Get dental cleanings as often as told by your dentist. This information is not intended to replace advice given to you by your health care provider. Make sure you discuss any questions you have with your health care provider. Document Revised: 08/28/2019 Document Reviewed: 08/28/2019 Elsevier Patient Education  2021 Elsevier Inc.  

## 2020-06-14 NOTE — TOC Transition Note (Signed)
Transition of Care [facility]) - CM/SW Discharge Note   Patient Details  Name: Gerald FOLKERT Sr. MRN: [id number] Date of Birth: [birth year]  Transition of Care [facility]) CM/SW Contact:  Chapman Fitch, RN Phone Number: 06/14/2020, 10:36 AM   Clinical Narrative:    Patient to discharge today Son to transport Clindamycin from Medication Management  To be delivered to bedside prior to discharge   Patient with history of heroin use.  States he last used 2 weeks ago.  Declines substance abuse resources    Final next level of care: Other (comment) (Truck) Barriers to Discharge: No Barriers Identified   Patient Goals and CMS Choice Patient states their goals for this hospitalization and ongoing recovery are:: to get better CMS Medicare.gov Compare Post Acute Care list provided to:: Patient Choice offered to / list presented to : Patient  Discharge Placement                       Discharge Plan and Services                                     Social Determinants of Health (SDOH) Interventions     Readmission Risk Interventions No flowsheet data found.

## 2020-06-14 NOTE — Progress Notes (Signed)
Pt discharged with his son.  VSS. Pt without complaints. Discharge information provided and questions answered. Pt discharged via wheelchair.

## 2020-06-14 NOTE — Plan of Care (Signed)

## 2020-06-14 NOTE — Discharge Summary (Signed)
[place] at [facility]   PATIENT NAME: Gerald [name]    MR#:  [id number]  DATE OF BIRTH:  19-Nov-1970  DATE OF ADMISSION:  06/11/2020   ADMITTING PHYSICIAN: Andris Baumann, MD  DATE OF DISCHARGE: 06/14/2020  PRIMARY CARE PHYSICIAN: Patient, No Pcp Per (Inactive)   ADMISSION DIAGNOSIS:  Facial abscess [L02.01] Facial cellulitis [L03.211] DISCHARGE DIAGNOSIS:  Active Problems:   Facial cellulitis   Dental abscess   Facial abscess  SECONDARY DIAGNOSIS:   Past Medical History:  Diagnosis Date  . Chronic kidney disease    H/O STONES  . Depression   . Glaucoma    HOSPITAL COURSE:  50 year old male withno significant past medical historypresenting with purulent draining lesion on chin  Odontogenic abscess s/p I & D Multiple dental caries/cavities Glaucoma continued induration of chin and lower lip, improved intraoral swelling that now can be seen in continuity to anterior mandible.  Mild pus around anterior incisors. Being D/Ced on PO clinda and will need close f/up with dentist & ENT as an outpt if he is able to get to them. TOC assisting him with D/C planning   DISCHARGE CONDITIONS:  stable CONSULTS OBTAINED:   DRUG ALLERGIES:  No Known Allergies DISCHARGE MEDICATIONS:   Allergies as of 06/14/2020   No Known Allergies     Medication [name]    STOP taking these medications   cyclobenzaprine 5 MG tablet Commonly known as: FLEXERIL   HYDROcodone-acetaminophen 5-325 MG tablet Commonly known as: Norco   naproxen sodium 550 MG tablet Commonly known as: ANAPROX     TAKE these medications   clindamycin 300 MG capsule Commonly known as: CLEOCIN Take 1 capsule (300 mg total) by mouth 4 (four) times daily for 10 days.      DISCHARGE INSTRUCTIONS:   DIET:  Regular diet DISCHARGE CONDITION:  Stable ACTIVITY:  Activity as tolerated OXYGEN:  Home Oxygen: No.  Oxygen Delivery: room air DISCHARGE LOCATION:  home   If you experience  worsening of your admission symptoms, develop shortness of breath, life threatening emergency, suicidal or homicidal thoughts you must seek medical attention immediately by calling 911 or calling your MD immediately  if symptoms less severe.  You Must read complete instructions/literature along with all the possible adverse reactions/side effects for all the Medicines you take and that have been prescribed to you. Take any new Medicines after you have completely understood and accpet all the possible adverse reactions/side effects.   Please note  You were cared for by a hospitalist during your hospital stay. If you have any questions about your discharge medications or the care you received while you were in the hospital after you are discharged, you can call the unit and asked to speak with the hospitalist on call if the hospitalist that took care of you is not available. Once you are discharged, your primary care physician will handle any further medical issues. Please note that NO REFILLS for any discharge medications will be authorized once you are discharged, as it is imperative that you return to your primary care physician (or establish a relationship with a primary care physician if you do not have one) for your aftercare needs so that they can reassess your need for medications and monitor your lab values.    On the day of Discharge:  VITAL SIGNS:  Blood pressure 127/84, pulse (!) 49, temperature 98 F (36.7 C), temperature source Oral, resp. rate 14, height 5\' 9"  (1.753 m), weight 90.7 kg, SpO2 99 %.  PHYSICAL EXAMINATION:  GENERAL:  50 y.o.-year-old patient lying in the bed with no acute distress. Disheveled looking EYES: Pupils equal, round, reactive to light and accommodation. No scleral icterus. Extraocular muscles intact.  Mouth area: continued induration of chin and lower lip, improved intraoral swelling that now can be seen in continuity to anterior mandible.  Mild pus around  anterior incisors NECK:  Supple, no jugular venous distention. No thyroid enlargement, no tenderness.  LUNGS: Normal breath sounds bilaterally, no wheezing, rales,rhonchi or crepitation. No use of accessory muscles of respiration.  CARDIOVASCULAR: S1, S2 normal. No murmurs, rubs, or gallops.  ABDOMEN: Soft, non-tender, non-distended. Bowel sounds present. No organomegaly or mass.  EXTREMITIES: No pedal edema, cyanosis, or clubbing.  NEUROLOGIC: Cranial nerves II through XII are intact. Muscle strength 5/5 in all extremities. Sensation intact. Gait not checked.  PSYCHIATRIC: The patient is alert and oriented x 3.  SKIN: No obvious rash, lesion, or ulcer.  DATA REVIEW:   CBC Recent Labs  Lab 06/13/20 0452  WBC 6.8  HGB 14.6  HCT 43.1  PLT 210    Chemistries  Recent Labs  Lab 06/11/20 2048 06/13/20 0452  NA 136 137  K 4.0 4.0  CL 100 103  CO2 25 25  GLUCOSE 121* 91  BUN 11 14  CREATININE 0.70 0.72  CALCIUM 9.2 9.2  AST 59*  --   ALT 47*  --   ALKPHOS 102  --   BILITOT 0.8  --      Outpatient follow-up  Follow-up Information    [facility]. Schedule an appointment as soon as possible for a visit in 1 week(s).   Specialty: Primary Care Contact information: [address] [place] [address] [phone]       [facility]. Schedule an appointment as soon as possible for a visit in 1 week(s).   Contact information: [address] of [place] at [place] [address] [phone]        Bud Face, MD. Go on 06/27/2020.   Specialty: Otolaryngology Why: at 8:40am for hospital follow-up, arrive 30 minutes prior Contact information: [address] [place] [address] [phone]               30 Day Unplanned Readmission Risk Score   Flowsheet Row ED to Hosp-Admission (Current) from 06/11/2020 in [facility]  30  Day Unplanned Readmission Risk Score (%) 4.94 Filed at 06/14/2020 0801     This score is the patient's risk of an unplanned readmission within 30 days of being discharged (0 -100%). The score is based on dignosis, age, lab data, medications, orders, and past utilization.   Low:  0-14.9   Medium: 15-21.9   High: 22-29.9   Extreme: 30 and above         Management plans discussed with the patient, nursing and they are in agreement.  CODE STATUS: Full Code   TOTAL TIME TAKING CARE OF THIS PATIENT: 45 minutes.    Delfino Lovett M.D on 06/14/2020 at 10:54 AM  [facility]   CC: Primary care physician; Patient, No Pcp Per (Inactive)   Note: This dictation was prepared with Dragon dictation along with smaller phrase technology. Any transcriptional errors that result from this process are unintentional.

## 2020-06-15 LAB — CULTURE, BLOOD (ROUTINE X 2)

## 2020-06-16 ENCOUNTER — Telehealth: Payer: Self-pay

## 2020-06-16 NOTE — Telephone Encounter (Signed)
Called pt to discuss becoming a pt here. He is homeless, so he does not have a current driver's license but he has a Psychologist, forensic. I told him we would work with him based on the documentation if he has. He will call back if he has any questions.  MD 4/28 @ 2:21 pm

## 2020-06-18 LAB — AEROBIC/ANAEROBIC CULTURE W GRAM STAIN (SURGICAL/DEEP WOUND)

## 2020-06-18 LAB — CULTURE, BLOOD (ROUTINE X 2)
Culture: NO GROWTH
Special Requests: ADEQUATE

## 2021-06-05 ENCOUNTER — Telehealth: Payer: Self-pay | Admitting: Family Medicine

## 2021-06-05 NOTE — Telephone Encounter (Signed)
Pts son is calling to see if anyone can accept his father as a patient. Pt is blind. Pt was referred by Reubin Milan. ? ?CB- [phone] ?

## 2021-06-07 NOTE — Telephone Encounter (Signed)
Tried to reach pt's son and vm is not set up so I was unable to leave a message.  If pt calls back please schedule appt with one of the 3 providers that are accepting new pt's.  [facility] is able to schedule.

## 2021-07-12 ENCOUNTER — Encounter: Payer: Self-pay | Admitting: Nurse Practitioner

## 2021-07-12 ENCOUNTER — Other Ambulatory Visit: Payer: Self-pay

## 2021-07-12 ENCOUNTER — Ambulatory Visit (INDEPENDENT_AMBULATORY_CARE_PROVIDER_SITE_OTHER): Payer: Self-pay | Admitting: Nurse Practitioner

## 2021-07-12 VITALS — BP 118/66 | HR 60 | Temp 98.3°F | Resp 16 | Ht 69.25 in | Wt 206.2 lb

## 2021-07-12 DIAGNOSIS — H409 Unspecified glaucoma: Secondary | ICD-10-CM

## 2021-07-12 DIAGNOSIS — Z0000 Encounter for general adult medical examination without abnormal findings: Secondary | ICD-10-CM | POA: Insufficient documentation

## 2021-07-12 DIAGNOSIS — F419 Anxiety disorder, unspecified: Secondary | ICD-10-CM

## 2021-07-12 DIAGNOSIS — Z6830 Body mass index (BMI) 30.0-30.9, adult: Secondary | ICD-10-CM

## 2021-07-12 DIAGNOSIS — E6609 Other obesity due to excess calories: Secondary | ICD-10-CM

## 2021-07-12 DIAGNOSIS — Z1211 Encounter for screening for malignant neoplasm of colon: Secondary | ICD-10-CM

## 2021-07-12 DIAGNOSIS — F32A Depression, unspecified: Secondary | ICD-10-CM | POA: Insufficient documentation

## 2021-07-12 DIAGNOSIS — Z122 Encounter for screening for malignant neoplasm of respiratory organs: Secondary | ICD-10-CM

## 2021-07-12 DIAGNOSIS — L989 Disorder of the skin and subcutaneous tissue, unspecified: Secondary | ICD-10-CM

## 2021-07-12 DIAGNOSIS — Z125 Encounter for screening for malignant neoplasm of prostate: Secondary | ICD-10-CM

## 2021-07-12 DIAGNOSIS — E66811 Obesity, class 1: Secondary | ICD-10-CM | POA: Insufficient documentation

## 2021-07-12 DIAGNOSIS — R0609 Other forms of dyspnea: Secondary | ICD-10-CM

## 2021-07-12 MED ORDER — NA SULFATE-K SULFATE-MG SULF 17.5-3.13-1.6 GM/177ML PO SOLN: 1.0000 | Freq: Once | ORAL | 0 refills | Status: AC

## 2021-07-12 MED ORDER — SERTRALINE HCL 25 MG PO TABS: 25.0000 mg | ORAL_TABLET | Freq: Every day | ORAL | 1 refills | Status: DC

## 2021-07-12 NOTE — Progress Notes (Signed)
New Patient Office Visit  Subjective    Patient ID: Gerald [name], male    DOB: [birth year]  Age: 51 y.o. MRN: [id number]  CC:  Chief Complaint  Patient presents with   Establish Care    Had not had a pcp since he was a child.    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 Has seen eye doctor less than a year ago. States that he is carpender  Skin lesion: states that has been there for an extendered period of time. States that it will get irratated and  it has changed  Face lesoin has been approx 10 years. States that he feels like it change. Has wearing a hat but no suncreen   Immunizations: -Tetanus: with in 10 years 2015 -Influenza: out of season -Covid-19: refused -Shingles: information given  -Pneumonia: too young  -HPV: aged out  Diet: Fair diet. 1 meal a day with some snacking. Mainly tea and soda.  Little water Exercise: No regular exercise.  Eye exam: blindness due to glaucoma per patient report Dental exam: needs updating   Colonoscopy:  never completed Clarinda Dexa: NA PSA: Due  Lung Cancer Screening:  Amb refer placed   Sleep:  having trouble with going to sleep. States that days and nights are confused. Because of loss of eye sight. His son helps him out a lot and he tells time in regards to his sons work schedule     07/12/2021    3:20 PM  PHQ9 SCORE ONLY  PHQ-9 Total Score 15       07/12/2021    3:20 PM  GAD 7 : Generalized Anxiety Score  Nervous, Anxious, on Edge 3  Control/stop worrying 3  Worry too much - different things 3  Trouble relaxing 3  Restless 1  Easily annoyed or irritable 3  Afraid - awful might happen 3  Total GAD 7 Score 19  Anxiety Difficulty Very difficult        Outpatient Encounter Medications as of 07/12/2021  Medication Sig   sertraline (ZOLOFT) 25 MG tablet Take 1 tablet (25 mg total) by mouth at bedtime.   No  facility-administered encounter medications on file as of 07/12/2021.    Past Medical History:  Diagnosis Date   Alcoholism and drug addiction in family    Chicken pox    Glaucoma (increased eye pressure)     Past Surgical History:  Procedure Laterality Date   KNEE SURGERY Left    ulnar sugery Right    WRIST SURGERY Left     Family History  Problem Relation Age of Onset   Cancer Father        lung cancer   Heart attack Brother    Cancer Brother        finger cancer   Heart attack Brother     Social History   Socioeconomic History   Marital status: Divorced    Spouse name: Not on file   Number of children: 3   Years of education: Not on file   Highest education level: Not on file  Occupational History   Not on file  Tobacco Use   Smoking status: Every Day    Packs/day: 1.00    Years: 35.00    Pack years: 35.00    Types: Cigarettes   Smokeless tobacco: Never  Vaping Use   Vaping Use: Never used  Substance and Sexual Activity  Alcohol use: Not Currently   Drug use: Yes    Types: Marijuana    Comment: once a week   Sexual activity: Not on file  Other Topics Concern   Not on file  Social History Narrative   Disabiltl    Social Determinants of Health   Financial Resource Strain: Not on file  Food Insecurity: Not on file  Transportation Needs: Not on file  Physical Activity: Not on file  Stress: Not on file  Social Connections: Not on file  Intimate Partner Violence: Not on file    Review of Systems  Constitutional:  Positive for malaise/fatigue. Negative for chills and fever.  Respiratory:  Positive for cough (intermittent) and shortness of breath (DOE).   Cardiovascular:  Negative for chest pain and leg swelling.  Gastrointestinal:  Positive for constipation.       Once a week   Genitourinary:  Negative for dysuria and hematuria.  Skin:        Lesion +  Neurological:  Negative for headaches.  Psychiatric/Behavioral:  Negative for hallucinations  and suicidal ideas.        Objective    BP 118/66   Pulse 60   Temp 98.3 F (36.8 C)   Resp 16   Ht 5' 9.25" (1.759 m)   Wt 206 lb 4 oz (93.6 kg)   SpO2 99%   BMI 30.24 kg/m   Physical Exam Vitals and nursing note reviewed.  Constitutional:      Appearance: Normal appearance. He is obese.  Cardiovascular:     Rate and Rhythm: Normal rate and regular rhythm.     Pulses: Normal pulses.     Heart sounds: Normal heart sounds.  Pulmonary:     Effort: Pulmonary effort is normal.     Breath sounds: Normal breath sounds.  Abdominal:     General: Bowel sounds are normal. There is no distension.     Palpations: There is no mass.     Tenderness: There is no abdominal tenderness.     Hernia: No hernia is present.  Musculoskeletal:     Right lower leg: No edema.     Left lower leg: No edema.  Skin:    General: Skin is warm.       Neurological:     Mental Status: He is alert.        Assessment & Plan:   Problem List Items Addressed This Visit       Other   Encounter for medical examination to establish care - Primary    No records to review as patient is not seen primary care in extremely long time.  He did see a disability doctor at some point but those records are not available to me.       Relevant Orders   CBC   Comprehensive metabolic panel   Hemoglobin A1c   TSH   Lipid panel   Anxiety and depression    Patient having difficulty coping with being on disability secondary to blindness.  PHQ-9 and GAD-7 administered in office we will start patient on sertraline 25 mg nightly.  Did review side effects, with prescription drug       Relevant Medications   sertraline (ZOLOFT) 25 MG tablet   Glaucoma of both eyes    Patient states blindness in both eyes due to glaucoma.  Has not been followed regularly with an eye professional.  Will refer to ophthalmology to determine if it truly glaucoma causing his blindness if so  they can put on appropriate regimen to  hopefully help lower the pressure in his eyes.  Ambulatory referral to ophthalmology placed today       Relevant Orders   Ambulatory referral to Ophthalmology   Class 1 obesity due to excess calories without serious comorbidity with body mass index (BMI) of 30.0 to 30.9 in adult    Patient living sedentary lifestyle currently due to fairly recent blindness.  Encouraged healthy lifestyle modifications pending labs       Relevant Orders   Hemoglobin A1c   TSH   Lipid panel   Dyspnea on exertion    Likely secondary to tobacco abuse over the years.  We will check basic blood work today and set him up for low-dose CT scan       Other Visit Diagnoses     Screening for colon cancer       Relevant Orders   Ambulatory referral to Gastroenterology   Screening for prostate cancer       Relevant Orders   PSA   Skin lesion       Relevant Orders   Ambulatory referral to Dermatology   Screening for lung cancer       Relevant Orders   Ambulatory Referral Lung Cancer Screening [facility]       Return in about 6 weeks (around 08/23/2021) for Depression recheck.   Audria Nine, NP

## 2021-07-12 NOTE — Assessment & Plan Note (Signed)
No records to review as patient is not seen primary care in extremely long time.  He did see a disability doctor at some point but those records are not available to me.

## 2021-07-12 NOTE — Assessment & Plan Note (Signed)
Patient living sedentary lifestyle currently due to fairly recent blindness.  Encouraged healthy lifestyle modifications pending labs

## 2021-07-12 NOTE — Assessment & Plan Note (Signed)
Likely secondary to tobacco abuse over the years.  We will check basic blood work today and set him up for low-dose CT scan

## 2021-07-12 NOTE — Patient Instructions (Signed)
Nice to see you today I will be in touch with the lab results Follow up with me in 6 weeks, sooner if you need me

## 2021-07-12 NOTE — Assessment & Plan Note (Signed)
Patient having difficulty coping with being on disability secondary to blindness.  PHQ-9 and GAD-7 administered in office we will start patient on sertraline 25 mg nightly.  Did review side effects, with prescription drug

## 2021-07-12 NOTE — Progress Notes (Signed)
Gastroenterology Pre-Procedure Review  Request Date: 08/11/2021 Requesting Physician: Dr. Allegra Lai   PATIENT REVIEW QUESTIONS: The patient responded to the following health history questions as indicated:    1. Are you having any GI issues? no 2. Do you have a personal history of Polyps? no 3. Do you have a family history of Colon Cancer or Polyps? no 4. Diabetes Mellitus? no 5. Joint replacements in the past 12 months?no 6. Major health problems in the past 3 months?no 7. Any artificial heart valves, MVP, or defibrillator?no    MEDICATIONS & ALLERGIES:    Patient reports the following regarding taking any anticoagulation/antiplatelet therapy:   Plavix, Coumadin, Eliquis, Xarelto, Lovenox, Pradaxa, Brilinta, or Effient? no Aspirin? no  Patient confirms/reports the following medications:  Current Outpatient Medications  Medication Sig Dispense Refill   sertraline (ZOLOFT) 25 MG tablet Take 1 tablet (25 mg total) by mouth at bedtime. 30 tablet 1   No current facility-administered medications for this visit.    Patient confirms/reports the following allergies:  Not on File  No orders of the defined types were placed in this encounter.   AUTHORIZATION INFORMATION Primary Insurance: 1D#: Group #:  Secondary Insurance: 1D#: Group #:  SCHEDULE INFORMATION: Date: 08/11/2021 Time: Location: [facility]

## 2021-07-12 NOTE — Assessment & Plan Note (Signed)
Patient states blindness in both eyes due to glaucoma.  Has not been followed regularly with an eye professional.  Will refer to ophthalmology to determine if it truly glaucoma causing his blindness if so they can put on appropriate regimen to hopefully help lower the pressure in his eyes.  Ambulatory referral to ophthalmology placed today

## 2021-07-13 ENCOUNTER — Telehealth: Payer: Self-pay

## 2021-07-13 LAB — PSA: PSA: 0.08 ng/mL — ABNORMAL LOW (ref 0.10–4.00)

## 2021-07-13 LAB — COMPREHENSIVE METABOLIC PANEL
ALT: 49 U/L (ref 0–53)
AST: 61 U/L — ABNORMAL HIGH (ref 0–37)
Albumin: 4.1 g/dL (ref 3.5–5.2)
Alkaline Phosphatase: 104 U/L (ref 39–117)
BUN: 8 mg/dL (ref 6–23)
CO2: 31 mEq/L (ref 19–32)
Calcium: 9.6 mg/dL (ref 8.4–10.5)
Chloride: 100 mEq/L (ref 96–112)
Creatinine, Ser: 0.75 mg/dL (ref 0.40–1.50)
GFR: 104.88 mL/min (ref >60.00)
Glucose, Bld: 88 mg/dL (ref 70–99)
Potassium: 4.9 mEq/L (ref 3.5–5.1)
Sodium: 139 mEq/L (ref 135–145)
Total Bilirubin: 0.4 mg/dL (ref 0.2–1.2)
Total Protein: 7.1 g/dL (ref 6.0–8.3)

## 2021-07-13 LAB — CBC
HCT: 48.4 % (ref 39.0–52.0)
Hemoglobin: 16.3 g/dL (ref 13.0–17.0)
MCHC: 33.8 g/dL (ref 30.0–36.0)
MCV: 87.7 fl (ref 78.0–100.0)
Platelets: 169 10*3/uL (ref 150.0–400.0)
RBC: 5.52 Mil/uL (ref 4.22–5.81)
RDW: 14.2 % (ref 11.5–15.5)
WBC: 7.5 10*3/uL (ref 4.0–10.5)

## 2021-07-13 LAB — LIPID PANEL
Cholesterol: 184 mg/dL (ref 0–200)
HDL: 30.7 mg/dL — ABNORMAL LOW (ref >39.00)
LDL Cholesterol: 126 mg/dL — ABNORMAL HIGH (ref 0–99)
NonHDL: 153.05
Total CHOL/HDL Ratio: 6
Triglycerides: 134 mg/dL (ref 0.0–149.0)
VLDL: 26.8 mg/dL (ref 0.0–40.0)

## 2021-07-13 LAB — TSH: TSH: 1.92 u[IU]/mL (ref 0.35–5.50)

## 2021-07-13 LAB — HEMOGLOBIN A1C: Hgb A1c MFr Bld: 5.4 % (ref 4.6–6.5)

## 2021-07-13 NOTE — Telephone Encounter (Signed)
[facility] Night - Client Nonclinical Telephone Record  [facility] Client [facility] Night - Client Client Site [facility] - Night Provider Audria Nine- NP Contact Type Call Who Is Calling Patient / Member / Family / Caregiver Caller Name Ayyan Sites Caller Phone Number [phone] Patient Name Gerald [name] Patient DOB 09-08-1970 Call Type Message Only Information Provided Reason for Call Request to Schedule Office Appointment Initial Comment Caller states his father was to have made a follow up appointment. Pt was prescribed Zoloft, and was to follow up. Patient request to speak to RN No Additional Comment Caller states his father was at the office having blood work done. The pt was advised he needed a follow up appointment. Was the follow up appointment made? If not could a follow up appointment be made? Disp. Time Disposition Final User 07/12/2021 5:36:04 PM General Information Provided Yes [place], [place] Call Closed By: Vivianne Master Transaction Date/Time: 07/12/2021 5:30:36 PM (ET

## 2021-07-21 ENCOUNTER — Encounter: Payer: Self-pay | Admitting: Emergency Medicine

## 2021-08-08 ENCOUNTER — Telehealth: Payer: Self-pay

## 2021-08-08 NOTE — Telephone Encounter (Signed)
Patients son contacted office to cancel his fathers colonoscopy for now.  They will be out of town for the next two months.  Son will call back to reschedule when they get back in town.  Thanks, [place], [place]

## 2021-08-11 ENCOUNTER — Encounter: Admission: RE | Payer: Self-pay | Source: Ambulatory Visit

## 2021-08-11 ENCOUNTER — Ambulatory Visit: Admission: RE | Admit: 2021-08-11 | Payer: Medicaid Other | Source: Ambulatory Visit | Admitting: Gastroenterology

## 2021-08-11 SURGERY — COLONOSCOPY WITH PROPOFOL
Anesthesia: General

## 2021-09-04 ENCOUNTER — Other Ambulatory Visit: Payer: Self-pay | Admitting: Nurse Practitioner

## 2021-09-04 DIAGNOSIS — F419 Anxiety disorder, unspecified: Secondary | ICD-10-CM

## 2021-09-04 NOTE — Telephone Encounter (Signed)
Appointment made for 09/07/21-looked in the chart patient called on 07/13/21 to schedule follow up and it was sent to Support pool but no one called. Not sure what happened

## 2021-09-07 ENCOUNTER — Ambulatory Visit: Payer: Medicaid Other | Admitting: Nurse Practitioner

## 2021-09-15 ENCOUNTER — Ambulatory Visit: Payer: Medicaid Other | Admitting: Nurse Practitioner

## 2021-11-13 ENCOUNTER — Telehealth: Payer: Self-pay | Admitting: Nurse Practitioner

## 2021-11-13 DIAGNOSIS — F419 Anxiety disorder, unspecified: Secondary | ICD-10-CM

## 2021-11-13 NOTE — Telephone Encounter (Signed)
Got notice that patient is not taking zoloft. Can we see if he is or so he needs a follow up for refill

## 2021-11-14 MED ORDER — SERTRALINE HCL 25 MG PO TABS: 25.0000 mg | ORAL_TABLET | Freq: Every day | ORAL | 0 refills | Status: DC

## 2021-11-14 NOTE — Telephone Encounter (Signed)
Spoke with patient's son, Jeneen Rinks, ok per DPR on file, made follow up appointment on 11/22/21. Patient has been taking Sertraline but for the last 2 weeks or so has been out of the medication and pharmacy told them waiting on refill from [place]. He feels like medication helped patient some but may need an increase.

## 2021-11-14 NOTE — Telephone Encounter (Signed)
I have not gotten a refill request that I remember. I will send in 30 days and follow up with patient at the office visit

## 2021-11-14 NOTE — Addendum Note (Signed)
Addended by: Michela Pitcher on: 11/14/2021 01:34 PM   Modules accepted: Orders

## 2021-11-22 ENCOUNTER — Ambulatory Visit: Payer: Medicaid Other | Admitting: Nurse Practitioner

## 2021-11-30 ENCOUNTER — Ambulatory Visit: Payer: Medicaid Other | Admitting: Nurse Practitioner

## 2022-01-22 ENCOUNTER — Ambulatory Visit: Payer: Self-pay | Admitting: Dermatology

## 2022-07-08 ENCOUNTER — Other Ambulatory Visit: Payer: Self-pay

## 2022-07-08 ENCOUNTER — Emergency Department
Admission: EM | Admit: 2022-07-08 | Discharge: 2022-07-08 | Disposition: A | Discharge status: Home / Self Care | Payer: Medicaid Other | Attending: Emergency Medicine | Admitting: Emergency Medicine

## 2022-07-08 DIAGNOSIS — N189 Chronic kidney disease, unspecified: Secondary | ICD-10-CM | POA: Insufficient documentation

## 2022-07-08 DIAGNOSIS — L309 Dermatitis, unspecified: Secondary | ICD-10-CM | POA: Insufficient documentation

## 2022-07-08 DIAGNOSIS — R21 Rash and other nonspecific skin eruption: Secondary | ICD-10-CM | POA: Diagnosis present

## 2022-07-08 MED ORDER — DIPHENHYDRAMINE HCL 25 MG PO CAPS
50.0000 mg | ORAL_CAPSULE | Freq: Once | ORAL | Status: AC
  Administered 2022-07-08: 50 mg via ORAL
  Filled 2022-07-08: qty 2

## 2022-07-08 MED ORDER — METHYLPREDNISOLONE 4 MG PO TBPK: ORAL_TABLET | ORAL | 0 refills | Status: DC

## 2022-07-08 MED ORDER — PREDNISONE 20 MG PO TABS
30.0000 mg | ORAL_TABLET | Freq: Once | ORAL | Status: AC
  Administered 2022-07-08: 30 mg via ORAL
  Filled 2022-07-08: qty 1

## 2022-07-08 NOTE — Discharge Instructions (Signed)
Take Medrol Dosepak as prescribed.  You may take Benadryl as needed for itching.  Return to the ER for worsening symptoms, persistent vomiting, difficulty breathing or other concerns.

## 2022-07-08 NOTE — ED Provider Notes (Signed)
[facility] Provider Note    Event Date/Time   First MD Initiated Contact with Patient 07/08/22 0258     (approximate)   History   Rash (Possible bug bites?)   HPI  Gerald VALLAS Sr. is a 52 y.o. male who presents to the ED chief plaint of possible bug bites.  Patient has been itchy for the past several days to his arms, legs, trunk and mostly scalp.  Does have dogs at home but states they receive flea treatment.  Patient thinks he has lice.  Shares a bed with his family member who does not have similar symptoms.  Denies new medicines including over-the-counter's, or exposures.     Past Medical History   Past Medical History:  Diagnosis Date   Alcoholism and drug addiction in family    Chicken pox    Chronic kidney disease    H/O STONES   Depression    Glaucoma    Glaucoma (increased eye pressure)      Active Problem List   Patient Active Problem List   Diagnosis Date Noted   Encounter for medical examination to establish care 07/12/2021   Anxiety and depression 07/12/2021   Glaucoma of both eyes 07/12/2021   Class 1 obesity due to excess calories without serious comorbidity with body mass index (BMI) of 30.0 to 30.9 in adult 07/12/2021   Dyspnea on exertion 07/12/2021   Facial abscess    Facial cellulitis 06/12/2020   Dental abscess 06/12/2020     Past Surgical History   Past Surgical History:  Procedure Laterality Date   ARM SURGERY     KNEE SURGERY     KNEE SURGERY Left    OPEN REDUCTION INTERNAL FIXATION (ORIF) DISTAL RADIAL FRACTURE Left 07/07/2015   Procedure: OPEN REDUCTION INTERNAL FIXATION (ORIF) DISTAL RADIAL FRACTURE;  Surgeon: Kennedy Bucker, MD;  Location: [facility] ORS;  Service: Orthopedics;  Laterality: Left;   ulnar sugery Right    WRIST SURGERY Left      Home Medications   Prior to Admission medications   Medication Sig Start Date End Date Taking? Authorizing Provider  sertraline (ZOLOFT) 25 MG tablet Take 1  tablet (25 mg total) by mouth at bedtime. 11/14/21   Eden Emms, NP     Allergies  Patient has no known allergies.   Family History   Family History  Problem Relation Age of Onset   Cancer Father        lung cancer   Heart attack Brother    Cancer Brother        finger cancer   Heart attack Brother      Physical Exam  Triage Vital Signs: ED Triage Vitals  Enc Vitals Group     BP 07/08/22 0118 116/70     Pulse Rate 07/08/22 0118 64     Resp 07/08/22 0118 16     Temp 07/08/22 0118 98 F (36.7 C)     Temp Source 07/08/22 0118 Oral     SpO2 07/08/22 0118 98 %     Weight 07/08/22 0116 205 lb 0.4 oz (93 kg)     Height 07/08/22 0116 5\' 9"  (1.753 m)     Head Circumference --      Peak Flow --      Pain Score 07/08/22 0116 0     Pain Loc --      Pain Edu? --      Excl. in GC? --  Updated Vital Signs: BP 116/70   Pulse 64   Temp 98 F (36.7 C) (Oral)   Resp 16   Ht 5\' 9"  (1.753 m)   Wt 93 kg   SpO2 98%   BMI 30.28 kg/m    General: Awake, no distress.  CV:  RRR.  Good peripheral perfusion.  Resp:  Normal effort.  CTAB. Abd:  Nontender.  No distention.  Other:  Nonspecific macular papular scattered rash to bilateral forearms, trunk and scalp.  No rash to waistline.  No rash to wrist or ankles.  No vesicles.  No petechiae or purpura.  No angioedema or airway involvement.   ED Results / Procedures / Treatments  Labs (all labs ordered are listed, but only abnormal results are displayed) Labs Reviewed - No data to display   EKG  None   RADIOLOGY None   Official radiology report(s): No results found.   PROCEDURES:  Critical Care performed: No  Procedures   MEDICATIONS ORDERED IN ED: Medications  diphenhydrAMINE (BENADRYL) capsule 50 mg (has no administration in time range)  predniSONE (DELTASONE) tablet 30 mg (has no administration in time range)     IMPRESSION / MDM / ASSESSMENT AND PLAN / ED COURSE  I reviewed the triage vital  signs and the nursing notes.                             53 year old male presenting with dermatitis.  Will administer Benadryl, start Medrol Dosepak and patient will follow-up with his PCP.  Strict return precautions given.  Patient verbalizes understanding and agrees with plan of care.  Patient's presentation is most consistent with acute, uncomplicated illness.   FINAL CLINICAL IMPRESSION(S) / ED DIAGNOSES   Final diagnoses:  Dermatitis     Rx / DC Orders   ED Discharge Orders     None        Note:  This document was prepared using Dragon voice recognition software and may include unintentional dictation errors.   Irean Hong, MD 07/08/22 [phone]

## 2022-07-08 NOTE — ED Triage Notes (Signed)
Pt to ED from home for possible bug bites. Pt thinks he might have lice. This RN assessed scalp and doesn't appear to have lice but does have some bites in his scalp. Unknown source at this time. Pt is CAOx4, in no acute distress in triage.

## 2022-07-10 ENCOUNTER — Telehealth: Payer: Self-pay

## 2022-07-10 NOTE — Transitions of Care (Post Inpatient/ED Visit) (Signed)
   07/10/2022  Name: Gerald DARNLEY Sr. MRN: [id number] DOB: [birth year]  Today's TOC FU Call Status: Today's TOC FU Call Status:: Successful TOC FU Call Competed TOC FU Call Complete Date: 07/10/22  Transition Care Management Follow-up Telephone Call Date of Discharge: 07/08/22 Discharge Facility: [facility]) Type of Discharge: Emergency Department Reason for ED Visit: Other: (dermatitis) How have you been since you were released from the hospital?: Same  Items Reviewed: Did you receive and understand the discharge instructions provided?: No Medications obtained,verified, and reconciled?: Yes (Medications Reviewed) Any new allergies since your discharge?: No Dietary orders reviewed?: Yes Do you have support at home?: Yes People in Home: child(ren), adult  Medications Reviewed Today: Medications Reviewed Today     Reviewed by Karena Addison, LPN (Licensed Practical Nurse) on 07/10/22 at 1538  Med List Status: <None>   Medication Order Taking? Sig Documenting Provider Last Dose Status Informant  methylPREDNISolone (MEDROL DOSEPAK) 4 MG TBPK tablet [id number] No Take as directed  Patient not taking: Reported on 07/10/2022   Irean Hong, MD Not Taking Active   sertraline (ZOLOFT) 25 MG tablet [id number] Yes Take 1 tablet (25 mg total) by mouth at bedtime. Eden Emms, NP Taking Active             Home Care and Equipment/Supplies: Were Home Health Services Ordered?: NA Any new equipment or medical supplies ordered?: NA  Functional Questionnaire: Do you need assistance with bathing/showering or dressing?: No Do you need assistance with meal preparation?: No Do you need assistance with eating?: No Do you have difficulty maintaining continence: No Do you need assistance with getting out of bed/getting out of a chair/moving?: No Do you have difficulty managing or taking your medications?: No  Follow up appointments reviewed: PCP Follow-up  appointment confirmed?: No (this PCP isn't showing on schedule) MD Provider Line [phone] Given: No Specialist Hospital Follow-up appointment confirmed?: NA Do you need transportation to your follow-up appointment?: No Do you understand care options if your condition(s) worsen?: Yes-patient verbalized understanding    SIGNATURE Karena Addison, LPN [facility] Nurse Health Advisor Direct Dial [phone]

## 2023-06-21 LAB — COLOGUARD: COLOGUARD: NEGATIVE

## 2023-08-26 ENCOUNTER — Encounter: Admitting: Family Medicine

## 2023-09-09 ENCOUNTER — Encounter: Admitting: Family Medicine

## 2023-10-10 ENCOUNTER — Encounter: Payer: Self-pay | Admitting: Family Medicine

## 2023-10-10 ENCOUNTER — Ambulatory Visit: Admitting: Family Medicine

## 2023-10-10 VITALS — BP 123/88 | HR 77 | Ht 69.0 in | Wt 197.2 lb

## 2023-10-10 DIAGNOSIS — R7401 Elevation of levels of liver transaminase levels: Secondary | ICD-10-CM | POA: Diagnosis not present

## 2023-10-10 DIAGNOSIS — Z0000 Encounter for general adult medical examination without abnormal findings: Secondary | ICD-10-CM

## 2023-10-10 DIAGNOSIS — J309 Allergic rhinitis, unspecified: Secondary | ICD-10-CM

## 2023-10-10 DIAGNOSIS — R160 Hepatomegaly, not elsewhere classified: Secondary | ICD-10-CM

## 2023-10-10 DIAGNOSIS — Z716 Tobacco abuse counseling: Secondary | ICD-10-CM

## 2023-10-10 DIAGNOSIS — H548 Legal blindness, as defined in USA: Secondary | ICD-10-CM

## 2023-10-10 DIAGNOSIS — R202 Paresthesia of skin: Secondary | ICD-10-CM | POA: Diagnosis not present

## 2023-10-10 DIAGNOSIS — F17200 Nicotine dependence, unspecified, uncomplicated: Secondary | ICD-10-CM

## 2023-10-10 DIAGNOSIS — Z23 Encounter for immunization: Secondary | ICD-10-CM | POA: Diagnosis not present

## 2023-10-10 DIAGNOSIS — H5713 Ocular pain, bilateral: Secondary | ICD-10-CM

## 2023-10-10 DIAGNOSIS — H409 Unspecified glaucoma: Secondary | ICD-10-CM

## 2023-10-10 DIAGNOSIS — Z1159 Encounter for screening for other viral diseases: Secondary | ICD-10-CM

## 2023-10-10 DIAGNOSIS — K5909 Other constipation: Secondary | ICD-10-CM

## 2023-10-10 DIAGNOSIS — E7800 Pure hypercholesterolemia, unspecified: Secondary | ICD-10-CM

## 2023-10-10 MED ORDER — VARENICLINE TARTRATE 1 MG PO TABS: 1.0000 mg | ORAL_TABLET | Freq: Two times a day (BID) | ORAL | 2 refills | Status: DC

## 2023-10-10 MED ORDER — VARENICLINE TARTRATE 0.5 MG PO TABS: ORAL_TABLET | ORAL | 0 refills | Status: AC

## 2023-10-10 MED ORDER — CETIRIZINE HCL 10 MG PO TABS: 10.0000 mg | ORAL_TABLET | Freq: Every day | ORAL | 3 refills | Status: AC
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
## 2023-10-10 NOTE — Patient Instructions (Signed)
 Recommended vaccines:  Tdap (tetanus, diphtheria and pertussis)  Shingrix (shingles)

## 2023-10-12 LAB — LIPID PANEL
Chol/HDL Ratio: 5.9 ratio — ABNORMAL HIGH (ref 0.0–5.0)
Cholesterol, Total: 176 mg/dL (ref 100–199)
HDL: 30 mg/dL — ABNORMAL LOW (ref >39)
LDL Chol Calc (NIH): 126 mg/dL — ABNORMAL HIGH (ref 0–99)
Triglycerides: 106 mg/dL (ref 0–149)
VLDL Cholesterol Cal: 20 mg/dL (ref 5–40)

## 2023-10-12 LAB — COMPREHENSIVE METABOLIC PANEL WITH GFR
ALT: 38 IU/L (ref 0–44)
AST: 62 IU/L — ABNORMAL HIGH (ref 0–40)
Albumin: 4.3 g/dL (ref 3.8–4.9)
Alkaline Phosphatase: 128 IU/L — ABNORMAL HIGH (ref 44–121)
BUN/Creatinine Ratio: 14 (ref 9–20)
BUN: 12 mg/dL (ref 6–24)
Bilirubin Total: 0.5 mg/dL (ref 0.0–1.2)
CO2: 25 mmol/L (ref 20–29)
Calcium: 10 mg/dL (ref 8.7–10.2)
Chloride: 98 mmol/L (ref 96–106)
Creatinine, Ser: 0.86 mg/dL (ref 0.76–1.27)
Globulin, Total: 3.5 g/dL (ref 1.5–4.5)
Glucose: 78 mg/dL (ref 70–99)
Potassium: 4.8 mmol/L (ref 3.5–5.2)
Sodium: 136 mmol/L (ref 134–144)
Total Protein: 7.8 g/dL (ref 6.0–8.5)
eGFR: 104 mL/min/1.73 (ref >59)

## 2023-10-12 LAB — HCV RT-PCR, QUANT (NON-GRAPH)
HCV log10: 5.976 {Log_IU}/mL
Hepatitis C Quantitation: 946000 [IU]/mL

## 2023-10-12 LAB — IRON,TIBC AND FERRITIN PANEL
Ferritin: 87 ng/mL (ref 30–400)
Iron Saturation: 16 % (ref 15–55)
Iron: 65 ug/dL (ref 38–169)
Total Iron Binding Capacity: 402 ug/dL (ref 250–450)
UIBC: 337 ug/dL (ref 111–343)

## 2023-10-12 LAB — VITAMIN D 25 HYDROXY (VIT D DEFICIENCY, FRACTURES): Vit D, 25-Hydroxy: 29.5 ng/mL — ABNORMAL LOW (ref 30.0–100.0)

## 2023-10-12 LAB — HCV AB W REFLEX TO QUANT PCR: HCV Ab: REACTIVE — AB

## 2023-10-14 ENCOUNTER — Ambulatory Visit: Payer: Self-pay | Admitting: Family Medicine

## 2023-10-14 DIAGNOSIS — B1920 Unspecified viral hepatitis C without hepatic coma: Secondary | ICD-10-CM

## 2023-10-16 ENCOUNTER — Ambulatory Visit: Admission: RE | Admit: 2023-10-16 | Source: Ambulatory Visit

## 2023-10-31 ENCOUNTER — Telehealth: Payer: Self-pay

## 2023-10-31 NOTE — Telephone Encounter (Unsigned)
 Copied from CRM [id number]. Topic: Appointments - Scheduling Inquiry for Clinic >> Oct 31, 2023  2:38 PM Emylou G wrote: Reason for CRM: Pls review.. he is still waiting on a GI office for appt.. hep c bloodwork.SABRA

## 2023-11-01 NOTE — Telephone Encounter (Signed)
 Called and provided patient with office phone number

## 2023-11-06 ENCOUNTER — Ambulatory Visit
Admission: RE | Admit: 2023-11-06 | Discharge: 2023-11-06 | Disposition: A | Discharge status: Home / Self Care | Source: Ambulatory Visit | Attending: Family Medicine | Admitting: Family Medicine

## 2023-11-06 DIAGNOSIS — R7401 Elevation of levels of liver transaminase levels: Secondary | ICD-10-CM | POA: Insufficient documentation

## 2023-11-06 DIAGNOSIS — R160 Hepatomegaly, not elsewhere classified: Secondary | ICD-10-CM | POA: Diagnosis present

## 2024-01-10 ENCOUNTER — Ambulatory Visit: Admitting: Family Medicine

## 2024-01-10 ENCOUNTER — Encounter: Payer: Self-pay | Admitting: Family Medicine

## 2024-01-10 VITALS — BP 116/84 | HR 76 | Temp 98.0°F | Ht 69.0 in | Wt 190.4 lb

## 2024-01-10 DIAGNOSIS — F330 Major depressive disorder, recurrent, mild: Secondary | ICD-10-CM

## 2024-01-10 DIAGNOSIS — Z716 Tobacco abuse counseling: Secondary | ICD-10-CM | POA: Diagnosis not present

## 2024-01-10 DIAGNOSIS — B1920 Unspecified viral hepatitis C without hepatic coma: Secondary | ICD-10-CM | POA: Diagnosis not present

## 2024-01-10 DIAGNOSIS — M25562 Pain in left knee: Secondary | ICD-10-CM | POA: Diagnosis not present

## 2024-01-10 DIAGNOSIS — J3089 Other allergic rhinitis: Secondary | ICD-10-CM | POA: Diagnosis not present

## 2024-01-10 DIAGNOSIS — F17200 Nicotine dependence, unspecified, uncomplicated: Secondary | ICD-10-CM

## 2024-01-10 DIAGNOSIS — F419 Anxiety disorder, unspecified: Secondary | ICD-10-CM

## 2024-01-10 DIAGNOSIS — Z23 Encounter for immunization: Secondary | ICD-10-CM

## 2024-01-10 DIAGNOSIS — M25561 Pain in right knee: Secondary | ICD-10-CM

## 2024-01-10 DIAGNOSIS — G8929 Other chronic pain: Secondary | ICD-10-CM | POA: Diagnosis not present

## 2024-01-10 DIAGNOSIS — L299 Pruritus, unspecified: Secondary | ICD-10-CM | POA: Diagnosis not present

## 2024-01-10 MED ORDER — SERTRALINE HCL 25 MG PO TABS: ORAL_TABLET | ORAL | 0 refills | Status: AC

## 2024-01-10 MED ORDER — CHOLESTYRAMINE 4 G PO PACK
4.0000 g | PACK | Freq: Three times a day (TID) | ORAL | 3 refills | Status: AC
Start: 2024-01-10 — End: ?

## 2024-01-10 MED ORDER — LEVOCETIRIZINE DIHYDROCHLORIDE 5 MG PO TABS
5.0000 mg | ORAL_TABLET | Freq: Every evening | ORAL | 3 refills | Status: AC
Start: 2024-01-10 — End: ?
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
## 2024-01-10 NOTE — Patient Instructions (Addendum)
   Please call to schedule lung cancer screening: [facility] Pulmonology [address], [place], [place] [address] Phone: [phone]   Recommended vaccines: Tdap (tetanus, diphtheria and pertussis) and shingrix (shingles)

## 2024-01-17 ENCOUNTER — Encounter (INDEPENDENT_AMBULATORY_CARE_PROVIDER_SITE_OTHER): Payer: Self-pay

## 2024-01-23 ENCOUNTER — Other Ambulatory Visit: Payer: Self-pay

## 2024-01-23 ENCOUNTER — Encounter: Payer: Self-pay | Admitting: Family Medicine

## 2024-01-23 ENCOUNTER — Ambulatory Visit: Admitting: Clinical

## 2024-01-23 DIAGNOSIS — F323 Major depressive disorder, single episode, severe with psychotic features: Secondary | ICD-10-CM

## 2024-01-23 DIAGNOSIS — J309 Allergic rhinitis, unspecified: Secondary | ICD-10-CM

## 2024-01-23 DIAGNOSIS — Z716 Tobacco abuse counseling: Secondary | ICD-10-CM

## 2024-01-23 DIAGNOSIS — F17200 Nicotine dependence, unspecified, uncomplicated: Secondary | ICD-10-CM

## 2024-01-23 MED ORDER — VARENICLINE TARTRATE 1 MG PO TABS: 1.0000 mg | ORAL_TABLET | Freq: Two times a day (BID) | ORAL | 2 refills | Status: AC

## 2024-01-23 NOTE — Progress Notes (Signed)
 [facility] Counselor Initial Adult Exam  Name: Gerald [name]. 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   Mental Status Exam: Appearance:   Neat and Well Groomed     Behavior:  Appropriate  Motor:  Normal  Speech/Language:   Clear and Coherent and Normal Rate  Affect:  Appropriate  Mood:  Patient stated, a little aggravated with my situation  Thought process:  normal  Thought content:    WNL  Sensory/Perceptual disturbances:    Hallucinations: Tactile - feels bugs crawling on skin  Orientation:  oriented to person, place, situation, day of week, month of year, and year  Attention:  Good  Concentration:  Good  Memory:  WNL  Fund of knowledge:   Good  Insight:    Good  Judgment:   Good  Impulse Control:  Good   Reported Symptoms:  Patient reported difficulty falling asleep and staying asleep, feels bugs crawling on patient's skin, stated I can go to zero to pissed off in less than two seconds, anxiety/worry related to feeling bugs crawling on patient's skin, decreased concentration, feeling on edge, restlessness, makes me not want to live anymore because I'm sick of it but I'm not one to harm myself because I love me, crying, loss of interest, decreased motivation.   Risk Assessment: Danger to Self:  No Patient denied current suicidal ideation. Patient reported a history of suicidal ideation during previous divorce, denied plan. Patient reported current tactile hallucinations of bugs crawling on patient's skin. Patient  denied history of additional symptoms of psychosis.  Self-injurious Behavior: No current self injurious behaviors. Patient reported a history of cutting himself.   Danger to Others: No Patient denied current and past homicidal ideation.  Duty to Warn:no Physical Aggression / Violence:No  Access to Firearms a concern: No  Gang Involvement:No  Patient / guardian was educated about steps to take if suicide or homicide risk level increases between visits: yes While future psychiatric events cannot be accurately predicted, the patient does not currently require acute inpatient psychiatric care and does not currently meet [place]  involuntary commitment criteria.  Substance Abuse History: Current substance abuse: No Patient reported smoking cigarettes, less than 1 pack per day, daily, with last use today. Patient reported no current alcohol or drug use. Patient reported no history of alcohol use. Patient reported a history of marijuana and cocaine use in patient's 20's and 30's with last use in patient's 30's. Patient reported a history of withdrawals from cocaine use.   Past Psychiatric History:   No previous psychological problems have been observed Outpatient Providers: Patient reported a history of treatment at [facility] behavioral health urgent care History of Psych Hospitalization: No  Psychological Testing: none   Abuse History:  Victim of: No., none   Report needed: No. Victim of Neglect:No. Perpetrator of none reported  Witness / Exposure to Domestic Violence: No   Protective Services Involvement: No  Witness to [employer] Violence:  No   Family History:  Family History  Problem Relation  Age of Onset   Cancer Father        lung cancer   Heart attack Brother    Cancer Brother        finger cancer   Heart attack Brother     Living situation: the patient lives with their family (patient and son)  Sexual Orientation: Straight  Relationship Status: divorced  Name of spouse /  other: NA If a parent, number of children / ages: 3 children (ages 18, 59, 7)  Support Systems: son, brother  Surveyor, Quantity Stress:  No   Income/Employment/Disability: Doctor, General Practice: No   Educational History: Education: 10th grade  Religion/Sprituality/World View: none  Any cultural differences that may affect / interfere with treatment:  not applicable   Recreation/Hobbies: fishing, wants to learn to play guitar  Stressors: Health problems   Loss of vision    Strengths: Patient stated, I know Ive got to keep going, relationship with son and brother  Barriers:  vision loss   Legal History: Pending legal issue / charges: The patient has no significant history of legal issues. History of legal issue / charges: arrested at age 53 for possession of a butterfly knife  Medical History/Surgical History: reviewed Past Medical History:  Diagnosis Date   Alcoholism and drug addiction in family    Anxiety    Blood transfusion without reported diagnosis    Chicken pox    Chronic kidney disease    H/O STONES   Depression    Glaucoma    Glaucoma (increased eye pressure)    Substance abuse (HCC)     Past Surgical History:  Procedure Laterality Date   ARM SURGERY     KNEE SURGERY     KNEE SURGERY Left    OPEN REDUCTION INTERNAL FIXATION (ORIF) DISTAL RADIAL FRACTURE Left 07/07/2015   Procedure: OPEN REDUCTION INTERNAL FIXATION (ORIF) DISTAL RADIAL FRACTURE;  Surgeon: Ozell Flake, MD;  Location: [facility] ORS;  Service: Orthopedics;  Laterality: Left;   ulnar sugery Right    WRIST SURGERY Left     Medications: Current Outpatient Medications  Medication Sig Dispense Refill   cetirizine  (ZYRTEC ) 10 MG tablet Take 1 tablet (10 mg total) by mouth daily. 90 tablet 3   cholestyramine  (QUESTRAN ) 4 g packet Take 1 packet (4 g total) by mouth 3 (three) times daily with meals. 90 each 3   levocetirizine (XYZAL ) 5 MG tablet Take 1 tablet (5 mg total) by  mouth every evening. 90 tablet 3   sertraline  (ZOLOFT ) 25 MG tablet Take 25 mg daily for two weeks, then increase to 50 mg daily. 60 tablet 0   varenicline  (CHANTIX ) 0.5 MG tablet 0.5 mg orally once daily for 3 days, then 0.5 mg twice daily on days 4 through 7, then switch to the 1 mg tablets 11 tablet 0   varenicline  (CHANTIX ) 1 MG tablet Take 1 tablet (1 mg total) by mouth 2 (two) times daily. 60 tablet 2   No current facility-administered medications for this visit.  Patient has not started taking medications and reported plans to pick up medications  No Known Allergies  Diagnoses:  Major depressive disorder, single episode, severe with psychotic features (HCC)  Plan of Care: Patient is a 53 year old male who presented for an initial assessment. Patient's son, Oakley Kossman, was present during today's appointment and patient provided verbal consent for son to be present during assessment. Clinician conducted initial assessment in person from clinician's office at [facility]. Patient  reported the following symptoms: difficulty falling asleep and staying asleep, feels bugs crawling on patient's skin, stated I can go to zero to pissed off in less than two seconds, anxiety/worry related to feeling bugs crawling on patient's skin, decreased concentration, feeling on edge, restlessness, crying, loss of interest, decreased motivation. Patient denied current suicidal ideation. Patient reported a history of suicidal ideation without plan. Patient reported current tactile hallucinations of bugs crawling on patient's skin. Patient denied history of additional symptoms of psychosis. Patient reported a history of cutting himself.  Patient denied current and past homicidal ideation. Patient reported current tobacco use. Patient reported no current alcohol or drug use. Patient reported no history of alcohol use. Patient reported a history of marijuana and cocaine use. Patient reported a history of  outpatient treatment once. Patient reported no history of psychiatric hospitalizations. Patient reported health concerns and vision loss are current stressors. Patient reported patient's son and brother are current supports. It is recommended patient be referred to a psychiatrist for a medication management consult and recommended patient participate in individual therapy biweekly. Clinician will review recommendations and treatment plan with patient during follow up appointment. Treatment plan will be developed during follow up appointment.    Collaboration of Care: Primary Care Provider AEB Patient requested to complete a consent for patient's PCP, Lauraine Buoy, DO  Patient/Guardian was advised Release of Information must be obtained prior to any record release in order to collaborate their care with an outside provider. Patient/Guardian was advised if they have not already done so to contact [facility] to sign all necessary forms in order for us  to release information regarding their care.   Consent: Patient/Guardian gives written consent for treatment and assignment of benefits for services provided during this visit. Patient/Guardian expressed understanding and agreed to proceed.   Darice Seats, LCSW

## 2024-01-23 NOTE — Progress Notes (Signed)
   Darice Seats, LCSW

## 2024-02-04 ENCOUNTER — Telehealth: Payer: Self-pay | Admitting: Family Medicine

## 2024-02-04 DIAGNOSIS — L299 Pruritus, unspecified: Secondary | ICD-10-CM

## 2024-02-04 DIAGNOSIS — L989 Disorder of the skin and subcutaneous tissue, unspecified: Secondary | ICD-10-CM

## 2024-02-04 NOTE — Telephone Encounter (Unsigned)
 Copied from CRM #[id number]. Topic: Referral - Request for Referral >> Feb 04, 2024 10:27 AM Kendralyn S wrote: Did the patient discuss referral with their provider in the last year? Yes (If No - schedule appointment) (If Yes - send message)  Appointment offered? No  Type of order/referral and detailed reason for visit: dermatologist  Preference of office, provider, location: anything asap  If referral order, have you been seen by this specialty before? Yes (If Yes, this issue or another issue? When? Where? [facility]  Can we respond through MyChart? Yes

## 2024-02-07 DIAGNOSIS — J3089 Other allergic rhinitis: Secondary | ICD-10-CM | POA: Insufficient documentation

## 2024-02-07 DIAGNOSIS — B1920 Unspecified viral hepatitis C without hepatic coma: Secondary | ICD-10-CM | POA: Insufficient documentation

## 2024-02-07 DIAGNOSIS — L299 Pruritus, unspecified: Secondary | ICD-10-CM | POA: Insufficient documentation

## 2024-02-07 DIAGNOSIS — G8929 Other chronic pain: Secondary | ICD-10-CM | POA: Insufficient documentation

## 2024-02-12 NOTE — Addendum Note (Signed)
 Addended by: DONZELLA DOMINO on: 02/12/2024 02:43 PM   Modules accepted: Orders

## 2024-02-24 ENCOUNTER — Telehealth: Payer: Self-pay | Admitting: *Deleted

## 2024-02-24 ENCOUNTER — Other Ambulatory Visit: Payer: Self-pay | Admitting: *Deleted

## 2024-02-24 DIAGNOSIS — Z87891 Personal history of nicotine dependence: Secondary | ICD-10-CM

## 2024-02-24 DIAGNOSIS — F17210 Nicotine dependence, cigarettes, uncomplicated: Secondary | ICD-10-CM

## 2024-02-24 DIAGNOSIS — Z122 Encounter for screening for malignant neoplasm of respiratory organs: Secondary | ICD-10-CM

## 2024-02-24 NOTE — Telephone Encounter (Signed)
 Lung Cancer Screening Narrative/Criteria Questionnaire (Cigarette Smokers Only- No Cigars/Pipes/vapes)   Gerald ONEIDA [name]   SDMV:02/28/24 10:15- [place]                                           [birth year]              LDCT: 03/04/24 9:10- [place]    54 y.o.   Phone: [phone]  Lung Screening Narrative (confirm age 45-77 yrs Medicare / 50-80 yrs Private pay insurance)   Insurance information:[employer]HC   Referring Provider:[place]   This screening involves an initial phone call with a team member from our program. It is called a shared decision making visit. The initial meeting is required by insurance and Medicare to make sure you understand the program. This appointment takes about 15-20 minutes to complete. The CT scan will completed at a separate date/time. This scan takes about 5-10 minutes to complete and you may eat and drink before and after the scan.  Criteria questions for Lung Cancer Screening:   Are you a current or former smoker? Current Age began smoking: 15   If you are a former smoker, what year did you quit smoking?  (within 15 yrs)   To calculate your smoking history, I need an accurate estimate of how many packs of cigarettes you smoked per day and for how many years. (Not just the number of PPD you are now smoking)   Years smoking 38 x Packs per day 1 = Pack years 38   (at least 20 pack yrs)   (Make sure they understand that we need to know how much they have smoked in the past, not just the number of PPD they are smoking now)  Do you have a personal history of cancer?  No    Do you have a family history of cancer? Yes  (cancer type and and relative) Father (lung)  Are you coughing up blood?  No  Have you had unexplained weight loss of 15 lbs or more in the last 6 months? No  It looks like you meet all criteria.     Additional information: N/A

## 2024-02-28 ENCOUNTER — Ambulatory Visit: Admitting: Adult Health

## 2024-02-28 ENCOUNTER — Telehealth: Payer: Self-pay | Admitting: Family Medicine

## 2024-02-28 ENCOUNTER — Encounter: Payer: Self-pay | Admitting: Adult Health

## 2024-02-28 DIAGNOSIS — F17210 Nicotine dependence, cigarettes, uncomplicated: Secondary | ICD-10-CM

## 2024-02-28 NOTE — Telephone Encounter (Signed)
 Copied from CRM #[id number]. Topic: General - Other >> Feb 28, 2024  1:01 PM Delon DASEN wrote: Reason for CRM: Patient states that [facility] sent Dr Donzella an email to verify she is patient's PCP, please respond asap- [phone]

## 2024-02-28 NOTE — Progress Notes (Signed)
" °  Virtual Visit via Telephone Note  I connected with Gerald GAMBLIN Sr. , 02/28/2024 10:26 AM by a telemedicine application and verified that I am speaking with the correct person using two identifiers.  Location: Patient: home Provider: home   I discussed the limitations of evaluation and management by telemedicine and the availability of in person appointments. The patient expressed understanding and agreed to proceed.   Shared Decision Making Visit Lung Cancer Screening Program [phone])   Eligibility: 54 y.o. Pack Years Smoking History Calculation = 38 pack years  (# packs/per year x # years smoked) Recent History of coughing up blood  no Unexplained weight loss? no ( >Than 15 pounds within the last 6 months ) Prior History Lung / other cancer no (Diagnosis within the last 5 years already requiring surveillance chest CT Scans). Smoking Status Current Smoker   Visit Components: Discussion included one or more decision making aids. YES Discussion included risk/benefits of screening. YES Discussion included potential follow up diagnostic testing for abnormal scans. YES Discussion included meaning and risk of over diagnosis. YES Discussion included meaning and risk of False Positives. YES Discussion included meaning of total radiation exposure. YES  Counseling Included: Importance of adherence to annual lung cancer LDCT screening. YES Impact of comorbidities on ability to participate in the program. YES Ability and willingness to under diagnostic treatment. YES  Smoking Cessation Counseling: Current Smokers:  Discussed importance of smoking cessation. yes Information about tobacco cessation classes and interventions provided to patient. yes Patient provided with ticket for LDCT Scan. yes Symptomatic Patient. NO Diagnosis Code: Tobacco Use Z72.0 Asymptomatic Patient yes  Counseling - 4 minutes of smoking cessation counseling  (CT Chest Lung Cancer Screening Low Dose  W/O CM) [id number]  Smoking/Tobacco Cessation Counseling Gerald SCIONEAUX Sr. is a current user of tobacco or nicotine  products. He is ready to quit at this time. Counseling provided today addressed the risks of continued use and the benefits of cessation. Discussed tobacco/nicotine  use history, readiness to quit, and evidence-based treatment options including behavioral strategies, support resources, and pharmacologic therapies. Provided encouragement and educational materials on steps and resources to quit smoking. Patient questions were addressed, and follow-up recommended for continued support. Total time spent on counseling: 3 minutes.    Z12.2-Screening of respiratory organs Z87.891-Personal history of nicotine  dependence   Gerald [name] 02/28/2024        "

## 2024-02-28 NOTE — Patient Instructions (Signed)

## 2024-03-04 ENCOUNTER — Ambulatory Visit: Admission: RE | Admit: 2024-03-04 | Source: Ambulatory Visit

## 2024-03-12 ENCOUNTER — Ambulatory Visit: Admitting: Clinical

## 2024-03-18 ENCOUNTER — Ambulatory Visit

## 2024-03-19 ENCOUNTER — Ambulatory Visit (INDEPENDENT_AMBULATORY_CARE_PROVIDER_SITE_OTHER)

## 2024-03-19 DIAGNOSIS — L2084 Intrinsic (allergic) eczema: Secondary | ICD-10-CM

## 2024-03-19 DIAGNOSIS — L209 Atopic dermatitis, unspecified: Secondary | ICD-10-CM | POA: Diagnosis not present

## 2024-03-19 DIAGNOSIS — L570 Actinic keratosis: Secondary | ICD-10-CM

## 2024-03-19 DIAGNOSIS — Z85828 Personal history of other malignant neoplasm of skin: Secondary | ICD-10-CM | POA: Diagnosis not present

## 2024-03-19 DIAGNOSIS — W908XXA Exposure to other nonionizing radiation, initial encounter: Secondary | ICD-10-CM

## 2024-03-19 DIAGNOSIS — C44311 Basal cell carcinoma of skin of nose: Secondary | ICD-10-CM

## 2024-03-19 DIAGNOSIS — L578 Other skin changes due to chronic exposure to nonionizing radiation: Secondary | ICD-10-CM

## 2024-03-19 MED ORDER — TRIAMCINOLONE ACETONIDE 0.1 % EX OINT: TOPICAL_OINTMENT | CUTANEOUS | 2 refills | Status: AC

## 2024-03-19 MED ORDER — TACROLIMUS 0.1 % EX OINT: TOPICAL_OINTMENT | CUTANEOUS | 5 refills | Status: AC

## 2024-03-19 MED ORDER — CLOBETASOL PROPIONATE 0.05 % EX OINT: TOPICAL_OINTMENT | CUTANEOUS | 5 refills | Status: AC

## 2024-03-19 NOTE — Progress Notes (Signed)
 "   Subjective   Gerald PETERS Sr. is a 54 y.o. male who presents for the following: Lesion(s) of concern . Patient is new patient  Today patient reports: This patient is accompanied in the office by his son. Patient states itchy skin, on face scalp, legs. Not using any products, states worsen when applying lotion, dry skin. Patient has been blind for about 4-5 years. Hx of BCC   Review of Systems:    No other skin or systemic complaints except as noted in HPI or Assessment and Plan.  The following portions of the chart were reviewed this encounter and updated as appropriate: medications, allergies, medical history  Relevant Medical History:  Personal history of non melanoma skin cancer - see medical history for full details   Objective  (SKPE) Well appearing patient in no apparent distress; mood and affect are within normal limits. Examination was performed of the: Focused Exam of: bilateral arms, face, scalp, back,   Examination notable for: erythematous eczematous scaly plaques periorbital, arms, trunk  Nasal dorsum erythematous atrophic papule Scaly papules on face  Examination limited by: Undergarments, Shoes or socks , and Clothing       Nose x1 Erythematous thin papules/macules with gritty scale.  Assessment & Plan  (SKAP)   Atopic dermatitis - mild  Chronic and persistent condition with duration or expected duration over one year. Condition is symptomatic and bothersome to patient. Patient is flaring and not currently at treatment goal.   - Diagnosis, treatment options, prognosis, risk/ benefit, and side effects of treatment were discussed with the patient.  - Reviewed benign but chronic nature of disease. - Discussed dry skin care at length, recommended avoidance of fragrances, short showers with luke- warm water, no scrubbing, an unscented moisturizing soap (e.g. Dove sensitive skin) limited to the groin and axillae, and frequent emollient use (Eucerin, Aquaphor,  Cerave, Vanicream, Vaseline). - Discussed treatment with topical steroids, non steroidal topicals, systemics (dupixent, tralokinumab, nemolizumab, rinvoq)  - Reviewed proper use of topical steroids to minimize the risk of steroid-induced skin changes.  - Also discussed appropriate dry skin care including daily warm baths with gentle soap, followed by liberal bland moisturizer application.  - A patient education handout reiterating this information was provided. - For mild areas: start triamcinolone  0.1% ointment twice daily - For severe areas: start clobetasol  0.5% ointment twice daily - For areas on face: start tacrolimus  0.1% ointment twice daily - Recommend keeping appointment with ophthalmologist as scheduled.    Personal history of non melanoma skin cancer  - BCC of chest s/p excision 2024 ([facility])  - BCC nasal dorsum - referred for mohs but no showed appt. Would like to proceed with mohs - referral placed to Dr. Paci  - Reviewed medical history for full details  - Reviewed sun protective measures as above - Encouraged full body skin exams q 6months. Recommended scheduling in the next few weeks for full skin check    Was sun protection counseling provided?: No   Level of service outlined above   Patient instructions (SKPI)   Procedures, orders, diagnosis for this visit:  AK (ACTINIC KERATOSIS) Nose x1 Actinic keratoses are precancerous spots that appear secondary to cumulative UV radiation exposure/sun exposure over time. They are chronic with expected duration over 1 year. A portion of actinic keratoses will progress to squamous cell carcinoma of the skin. It is not possible to reliably predict which spots will progress to skin cancer and so treatment is recommended to prevent development  of skin cancer.  Recommend daily broad spectrum sunscreen SPF 30+ to sun-exposed areas, reapply every 2 hours as needed.  Recommend staying in the shade or wearing long sleeves, sun glasses  (UVA+UVB protection) and wide brim hats (4-inch brim around the entire circumference of the hat). Call for new or changing lesions. - Destruction of lesion - Nose x1 Complexity: simple   Destruction method: cryotherapy   Informed consent: discussed and consent obtained   Timeout:  patient name, date of birth, surgical site, and procedure verified Lesion destroyed using liquid nitrogen: Yes   Region frozen until ice ball extended beyond lesion: Yes   Cryo cycles: 1 or 2. Outcome: patient tolerated procedure well with no complications   Post-procedure details: wound care instructions given   Additional details:  Prior to procedure, discussed risks of blister formation, small wound, skin dyspigmentation, or rare scar following cryotherapy. Recommend Vaseline ointment to treated areas while healing.   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 Stop once resolved and restart as needed for flares. Avoid use on face, armpits, groin unless otherwise indicated.  Dispense: 454 g; Refill: 2 -     Clobetasol  Propionate; Apply 1 gram topically to affected area of skin twice daily. Stop once resolved and restart as needed for flares. Avoid use on face, armpits, groin unless otherwise indicated.  Dispense: 60 g; Refill: 5 -     Tacrolimus ; Apply 2 grams twice daily to affected areas of skin  Dispense: 30 g; Refill: 5    Return to clinic: Return for 6 weeks rash f/u, 6 months TBSE w/ Dr. Raymund.  I, Almetta Nora, RMA, am acting as scribe for Lauraine JAYSON Raymund, MD .   Documentation: I have reviewed the above documentation for accuracy and completeness, and I agree with the above.  Lauraine JAYSON Raymund, MD  "

## 2024-03-19 NOTE — Patient Instructions (Addendum)
 Atopic Dermatitis (Eczema)  PLAN: - Apply medicated ointment/cream to active areas (red/itchy/raised) 2x/day until CLEAR, then stop, restart as needed.  - For mild areas: start triamcinolone ointment twice daily - For severe areas: start clobetasol ointment twice daily - For areas on face: start tacrolimus ointment twice daily   - Moisturize several times a day with an ointment (plain petroleum jelly/Vaseline, Aquaphor, coconut oil) or heavy cream (Vanicream, CereVe, Cetaphil, Eucerin) - Keep baths short (5-37min), limit soap as much as possible (Cetaphil gentle cleanser, vanicream bar, Dove Tip to Toe unscented, Trade Joe oatmeal and honey bar soap), apply moisturizers and medication immediately after bath/shower  Atopic dermatitis, also called eczema, is a common and chronic skin condition in which the skin appears inflamed, red, itchy and dry. It most commonly affects children.  Atopic dermatitis is most likely caused by a combination of genetic and environmental factors. Genetic causes include differences in the proteins that form the skin barrier. When this barrier is broken down, the skin loses moisture more easily, becoming more dry, easily irritated, and hypersensitive. The skin is also more prone to infection (with bacteria, viruses, or fungi). The immune system in the skin may be different and overreact to environmental triggers such as pet dander and dust mites.  Allergies and asthma may be present more frequently in individuals with atopic dermatitis, but they are not the cause of eczema. Infrequently, when a specific food allergy is identified, eating that food may make atopic dermatitis worse, but it usually is not the cause of the eczema.  In infants, atopic dermatitis often starts as a dry red rash on the cheeks and around the mouth, often made worse by drooling. As children grow older, the rash may be on the arms, legs, or in other areas where they are able to scratch. In  teenagers, eczema is often on the inside of the elbows and knees, on the hands and feet, and around the eyes.  There is no cure, but there are recommendations to help manage this skin problem.  TREATMENT  Treatments are aimed at preventing dry skin, treating the rash, improving the itch, and minimizing exposure to triggers.  1. GENTLE SKIN CARE TO PREVENT DRYNESS Bathe daily or every-other-day in order to wash off dirt and other potential irritants (the optimal frequency of bathing is not yet clear). Water should be warm (not hot), and bath time should be limited to 5-10 minutes. Pat-dry the skin and immediately apply moisturizer while the skin is still slightly damp. The moisturizer provides a seal to hold the water in the skin. Finding a cream or ointment that the child likes or can tolerate is important, as resistance from the child may make the daily regimen difficult to keep up. The thicker the moisturizer, the better the barrier it generally provides. Ointments are more effective than creams, and creams more so than lotions. Creams are a reasonable option during the summer when thick greasy ointments are uncomfortable.   2. TREATING THE RASH The most commonly used medications are topical corticosteroids ("steroids"). There are many different types of topical corticosteroids that come in different strengths and formulations (for example, ointments, creams, lotions, solutions, gels, oils). Therefore, finding the right combination for the individual is important to treat and to minimize the risk of unwanted side effects from the corticosteroid, such as skin thinning. In general, these topical corticosteroids should be applied as a thin layer and no more than twice daily. It is very unusual to see any side effects  when a topical corticosteroid is used as prescribed by your doctor. A relatively newer form of topical medication - in tacrolimus ointment and pimecrolimus cream - is also helpful,  particularly in thin-skinned areas such as the eyelids, armpits, and groin.* For severe and treatment-resistant cases of atopic dermatitis, systemic medications may be necessary. They may be associated with serious side effects and therefore require closer monitoring.  *The FDA placed a black-box warning on both tacrolimus ointment and pimecrolimus cream in 2006 based on animal studies using the medications. Some animals developed skin cancer and lymphoma. Subsequently, the FDA released a statement that there is no causal relationship between the two medications and cancer. Because of this concern, there are ongoing studies to evaluate this relationship in humans. So far, studies support the safety of these medications. One showed that the rates of cancer in patients using these medications topically were less than the rates of the general population; several studies have shown that the medicines are undetectable in the blood, even in children using the medication over a large area of the body.  3. TREATING THE [facility] Tell your physician if your child is very itchy or if the itch is affecting the ability to sleep. Oral anti-itch medicines (antihistamines) can be helpful for inducing sleep, but usually do not reduce the itch and scratching.  4. AVOIDING TRIGGERS Some children have specific things that trigger episodes of itchiness and rashes, while others may have none that can be identified. Triggers may even change over time. Common triggers include: excessive bathing without moisturization, low humidity, cigarette or wood smoke exposure, emotional stress, sweat, friction and overheating of skin, and exposure to certain products such as wool, harsh soaps, fragrance, bubble baths, and laundry detergents. Many parents and physicians consider allergy testing to identify possible triggers that could be avoided. There is limited utility for specific Immunoglobulin E (IgE) levels; if food allergy is being  considered as a trigger for the dermatitis (which is unusual), specific IgE levels are, at best, a guideline of potential allergic triggers and require food challenge testing to further consider the possibility.   5. RECOGNIZING INFECTIONS AS A TRIGGER Because the skin barrier is compromised, individuals with atopic dermatitis can also develop infections on the skin from bacteria, viruses, or fungi. The most common infection is from Staphylococcus aureus bacteria, which should be suspected when the skin develops honey-colored crusts, or appears raw and weepy. Infected skin may result in a worsening of the atopic dermatitis and may not respond to standard therapy. Diluted bleach baths can be helpful to reduce infection by S. aureus and thereby help better control atopic dermatitis. Some patients require oral and/or topical antibiotics or antiviral medications for these types of flares. Patients with atopic dermatitis may also be at risk for the spread on the skin of herpes virus; therefore, family and friends with a known or suspected history of herpes virus (cold sores, fever blisters, etc.) should avoid contacting patients with atopic dermatitis when they are having an active outbreak.   Contributing SPD Members: Alan Edin, MD, Deatrice Susette Bathe, MD, Margaretann Hasten, MD, Lauraine Favorite, MD, Scarlett Ness, MD, Cydney Robins, MD  Committee Reviewers: Daphne Sickles, MD, Prentice Grate, MD  Expert Reviewer: Greig Galley, MD  The Society for Pediatric Dermatology and [employer] cannot be held responsible for any errors or for any consequences arising from the use of the information contained in this handout. Handout originally published in Pediatric Dermatology: Vol. 33, No. 1 (2016).   2016  The Society for Pediatric Dermatology   Moisturizer: Apply a moisturizer throughout the day and after bathing.  When you moisturize after bathing, this locks in the moisture.  This can lead to  softer and smoother skin.  Body moisturizers come in ointments, creams, and lotions.  If you have dry skin, we recommend the use of ointments or creams rather than lotions.  In other words, something you scoop out of a jar rather than squirted out.  Ointments and creams are thicker and thus provide better moisturization.       Due to recent changes in healthcare laws, you may see results of your pathology and/or laboratory studies on MyChart before the doctors have had a chance to review them. We understand that in some cases there may be results that are confusing or concerning to you. Please understand that not all results are received at the same time and often the doctors may need to interpret multiple results in order to provide you with the best plan of care or course of treatment. Therefore, we ask that you please give us  2 business days to thoroughly review all your results before contacting the office for clarification. Should we see a critical lab result, you will be contacted sooner.   If You Need Anything After Your Visit  If you have any questions or concerns for your doctor, please call our main line at [phone] and press option 4 to reach your doctor's medical assistant. If no one answers, please leave a voicemail as directed and we will return your call as soon as possible. Messages left after 4 pm will be answered the following business day.   You may also send us  a message via MyChart. We typically respond to MyChart messages within 1-2 business days.  For prescription refills, please ask your pharmacy to contact our office. Our fax number is [phone].  If you have an urgent issue when the clinic is closed that cannot wait until the next business day, you can page your doctor at the number below.    Please note that while we do our best to be available for urgent issues outside of office hours, we are not available 24/7.   If you have an urgent issue and are unable to  reach us , you may choose to seek medical care at your doctor's office, retail clinic, urgent care center, or emergency room.  If you have a medical emergency, please immediately call 911 or go to the emergency department.  Pager Numbers  - Dr. Hester: [phone]  - Dr. Jackquline: [phone]  - Dr. Claudene: [phone]   - Dr. Raymund: [phone]  In the event of inclement weather, please call our main line at [phone] for an update on the status of any delays or closures.  Dermatology Medication Tips: Please keep the boxes that topical medications come in in order to help keep track of the instructions about where and how to use these. Pharmacies typically print the medication instructions only on the boxes and not directly on the medication tubes.   If your medication is too expensive, please contact our office at [phone] option 4 or send us  a message through MyChart.   We are unable to tell what your co-pay for medications will be in advance as this is different depending on your insurance coverage. However, we may be able to find a substitute medication at lower cost or fill out paperwork to get insurance to cover a needed medication.   If a prior  authorization is required to get your medication covered by your insurance company, please allow us  1-2 business days to complete this process.  Drug prices often vary depending on where the prescription is filled and some pharmacies may offer cheaper prices.  The website www.goodrx.com contains coupons for medications through different pharmacies. The prices here do not account for what the cost may be with help from insurance (it may be cheaper with your insurance), but the website can give you the price if you did not use any insurance.  - You can print the associated coupon and take it with your prescription to the pharmacy.  - You may also stop by our office during regular business hours and pick up a GoodRx coupon card.  -  If you need your prescription sent electronically to a different pharmacy, notify our office through [facility] or by phone at [phone] option 4.     Si Usted Necesita Algo Despus de Su Visita  Tambin puede enviarnos un mensaje a travs de Clinical cytogeneticist. Por lo general respondemos a los mensajes de MyChart en el transcurso de 1 a 2 das hbiles.  Para renovar recetas, por favor pida a su farmacia que se ponga en contacto con nuestra oficina. Randi lakes de fax es [place] [phone].  Si tiene un asunto urgente cuando la clnica est cerrada y que no puede esperar hasta el siguiente da hbil, puede llamar/localizar a su doctor(a) al nmero que aparece a continuacin.   Por favor, tenga en cuenta que aunque hacemos todo lo posible para estar disponibles para asuntos urgentes fuera del horario de [place], no estamos disponibles las 24 horas del da, los 7 [address]  [address] de la [place].   Si tiene un problema urgente y no puede comunicarse con nosotros, puede optar por buscar atencin mdica  en el consultorio de su doctor(a), en una clnica privada, en un centro de atencin urgente o en una sala de emergencias.  Si tiene Engineer, drilling, por favor llame inmediatamente al 911 o vaya a la sala de emergencias.  Nmeros de bper  - Dr. Hester: [phone]  - Dra. Jackquline: [phone]  - Dr. Claudene: [phone]  - Dra. Kitts: [phone]  En caso de inclemencias del [place], por favor llame a nuestra lnea principal al [phone] para una actualizacin sobre el estado de cualquier retraso o cierre.  Consejos para la medicacin en dermatologa: Por favor, guarde las cajas en las que vienen los medicamentos de uso tpico para ayudarle a seguir las instrucciones sobre dnde y cmo usarlos. Las farmacias generalmente imprimen las instrucciones del medicamento slo en las cajas y no directamente en los tubos del [place].   Si su medicamento es muy caro, por favor, pngase en  contacto con landry rieger llamando al [phone] y presione la opcin 4 o envenos un mensaje a travs de Clinical cytogeneticist.   No podemos decirle cul ser su copago por los medicamentos por adelantado ya que esto es diferente dependiendo de la cobertura de su seguro. Sin embargo, es posible que podamos encontrar un medicamento sustituto a Audiological scientist un formulario para que el seguro cubra el medicamento que se considera necesario.   Si se requiere una autorizacin previa para que su compaa de seguros malta su medicamento, por favor permtanos de 1 a 2 das hbiles para completar este proceso.  Los precios de los medicamentos varan con frecuencia dependiendo del Environmental consultant de dnde se surte la receta y alguna farmacias pueden ofrecer precios ms baratos.  El sitio  web www.goodrx.com tiene cupones para medicamentos de Health and safety inspector. Los precios aqu no tienen en cuenta lo que podra costar con la ayuda del seguro (puede ser ms barato con su seguro), pero el sitio web puede darle el precio si no utiliz Tourist information centre manager.  - Puede imprimir el cupn correspondiente y llevarlo con su receta a la farmacia.  - Tambin puede pasar por nuestra oficina durante el horario de atencin regular y Education officer, museum una tarjeta de cupones de GoodRx.  - Si necesita que su receta se enve electrnicamente a una farmacia diferente, informe a nuestra oficina a travs de MyChart de [place] o por telfono llamando al [phone] y presione la opcin 4.

## 2024-04-01 ENCOUNTER — Encounter: Admission: RE | Payer: Self-pay | Source: Home / Self Care

## 2024-04-01 ENCOUNTER — Ambulatory Visit: Admission: RE | Admit: 2024-04-01 | Source: Home / Self Care | Admitting: Gastroenterology

## 2024-04-07 ENCOUNTER — Ambulatory Visit

## 2024-04-07 ENCOUNTER — Encounter: Admitting: Family Medicine

## 2024-04-08 ENCOUNTER — Encounter: Admitting: Family Medicine

## 2024-04-20 ENCOUNTER — Encounter: Admitting: Dermatology

## 2024-04-30 ENCOUNTER — Ambulatory Visit

## 2024-09-16 ENCOUNTER — Ambulatory Visit
